# Patient Record
Sex: Male | Born: 1970 | Race: White | Hispanic: No | Marital: Married | Smoking: Current every day smoker
Health system: Southern US, Community
[De-identification: ages and names within clinical notes are randomized; demographics above are authoritative.]

## PROBLEM LIST (undated history)

## (undated) DIAGNOSIS — I493 Ventricular premature depolarization: Secondary | ICD-10-CM

## (undated) DIAGNOSIS — E785 Hyperlipidemia, unspecified: Secondary | ICD-10-CM

## (undated) DIAGNOSIS — I10 Essential (primary) hypertension: Secondary | ICD-10-CM

## (undated) DIAGNOSIS — I2510 Atherosclerotic heart disease of native coronary artery without angina pectoris: Secondary | ICD-10-CM

## (undated) DIAGNOSIS — N200 Calculus of kidney: Secondary | ICD-10-CM

## (undated) DIAGNOSIS — K5790 Diverticulosis of intestine, part unspecified, without perforation or abscess without bleeding: Secondary | ICD-10-CM

## (undated) DIAGNOSIS — T148XXA Other injury of unspecified body region, initial encounter: Secondary | ICD-10-CM

## (undated) DIAGNOSIS — I219 Acute myocardial infarction, unspecified: Secondary | ICD-10-CM

## (undated) HISTORY — DX: Hyperlipidemia, unspecified: E78.5

## (undated) HISTORY — PX: CORONARY ARTERY BYPASS GRAFT: SHX141

## (undated) HISTORY — PX: HEMORRHOID SURGERY: SHX153

## (undated) HISTORY — DX: Atherosclerotic heart disease of native coronary artery without angina pectoris: I25.10

## (undated) HISTORY — DX: Other injury of unspecified body region, initial encounter: T14.8XXA

## (undated) HISTORY — DX: Essential (primary) hypertension: I10

## (undated) HISTORY — PX: VASECTOMY: SHX75

## (undated) HISTORY — PX: CARDIAC SURGERY: SHX584

## (undated) HISTORY — DX: Diverticulosis of intestine, part unspecified, without perforation or abscess without bleeding: K57.90

## (undated) HISTORY — DX: Calculus of kidney: N20.0

## (undated) HISTORY — DX: Acute myocardial infarction, unspecified: I21.9

## (undated) HISTORY — DX: Ventricular premature depolarization: I49.3

## (undated) HISTORY — PX: OTHER SURGICAL HISTORY: SHX169

## (undated) HISTORY — PX: INGUINAL HERNIA REPAIR: SUR1180

## (undated) HISTORY — PX: LITHOTRIPSY: SUR834

---

## 2009-12-12 ENCOUNTER — Ambulatory Visit: Payer: Self-pay | Admitting: Cardiology

## 2012-10-02 ENCOUNTER — Encounter: Payer: Self-pay | Admitting: Cardiology

## 2012-10-02 ENCOUNTER — Ambulatory Visit (INDEPENDENT_AMBULATORY_CARE_PROVIDER_SITE_OTHER): Payer: Medicaid Other | Admitting: Cardiology

## 2012-10-02 VITALS — BP 113/76 | HR 93 | Ht 68.0 in | Wt 180.4 lb

## 2012-10-02 DIAGNOSIS — E785 Hyperlipidemia, unspecified: Secondary | ICD-10-CM

## 2012-10-02 DIAGNOSIS — I25810 Atherosclerosis of coronary artery bypass graft(s) without angina pectoris: Secondary | ICD-10-CM | POA: Insufficient documentation

## 2012-10-02 DIAGNOSIS — N200 Calculus of kidney: Secondary | ICD-10-CM | POA: Insufficient documentation

## 2012-10-02 MED ORDER — ASPIRIN EC 81 MG PO TBEC: 81.0000 mg | DELAYED_RELEASE_TABLET | Freq: Every day | ORAL | Status: DC

## 2012-10-02 MED ORDER — ATORVASTATIN CALCIUM 40 MG PO TABS: 40.0000 mg | ORAL_TABLET | Freq: Every day | ORAL | Status: DC

## 2012-10-02 NOTE — Patient Instructions (Addendum)
Please decrease your Asa to 81 mg a day Increase you Lipitor to 40 mg a day Continue all other medications as listed.  You have been referred to Cardiac Rehabilitation in [facility] and will be contacted about this.  Follow up with Dr Antoine Poche in 1 month.

## 2012-10-02 NOTE — Progress Notes (Signed)
HPI The patient presented with an acute myocardial infarction in early January. He was taken to [facility]. Cardiac cath demonstrated an occluded right coronary artery. The LAD had ostial 90% stenosis followed by a mid 95% stenosis. There was a moderate size mid diagonal with 80% stenosis. Ramus intermediate had 30% stenosis. The patient was treated with CABG with LIMA to the LAD, SVG to the ramus intermediate, SVG to the right coronary artery. The EF was 40% preoperatively and 55% postoperatively.  He reports a slow postoperative recovery as he had apparent hypoxemia. Since going home he has been weak and has had some continued dyspnea but he is not on home O2. His blood pressures have been low and his medications have been adjusted. More recent blood pressures have been about 100 systolic. He does have some lightheadedness but he's not having any syncope. He has some incisional chest soreness but none of the discomfort that was his acute event. He has occasional PVCs but he doesn't notice these. He's not having any PND or orthopnea. Of concern is back pain which she thinks is related to previous nephrolithiasis. He also has daily hematuria. He reports an indwelling stent.  Allergies  Allergen Reactions  . Codeine   . Demerol (Meperidine)   . Dilaudid (Hydromorphone Hcl)     IV  . Morphine And Related     Current Outpatient Prescriptions  Medication Sig Dispense Refill  . aspirin 325 MG tablet Take 325 mg by mouth daily.      Marland Kitchen atorvastatin (LIPITOR) 20 MG tablet Take 20 mg by mouth daily.      . clopidogrel (PLAVIX) 75 MG tablet Take 75 mg by mouth daily.      . DiphenhydrAMINE HCl (BENADRYL PO) Take by mouth as directed. Takes 1 tablet with pain medication      . HYDROmorphone (DILAUDID) 2 MG tablet Take 2 mg by mouth every 6 (six) hours as needed.      Marland Kitchen lisinopril (PRINIVIL,ZESTRIL) 2.5 MG tablet Take 2.5 mg by mouth daily.      Marland Kitchen loratadine (CLARITIN) 10 MG tablet Take 10 mg by  mouth daily.      Marland Kitchen METOPROLOL TARTRATE PO Take 75 mg by mouth 2 (two) times daily.      Marland Kitchen NICOTINE TD Place onto the skin.      . Tamsulosin HCl (FLOMAX) 0.4 MG CAPS Take by mouth daily.        Past Medical History  Diagnosis Date  . Nephrolithiasis   . Coronary artery disease     Past Surgical History  Procedure Date  . Kidney stents   . Coronary artery bypass graft   . Rotator cufff     right  . Hemorrhoid surgery     x 2  . Vasectomy   . Inguinal hernia repair     bilateral    Family History  Problem Relation Age of Onset  . Coronary artery disease Father 8  . Heart attack Paternal Grandmother     History   Social History  . Marital Status: Married    Spouse Name: N/A    Number of Children: 3  . Years of Education: N/A   Occupational History  . Not on file.   Social History Main Topics  . Smoking status: Former Smoker -- 1.0 packs/day for 25 years    Types: Cigarettes  . Smokeless tobacco: Not on file     Comment: Quit Jan 1st  . Alcohol Use: Not  on file  . Drug Use: Not on file  . Sexually Active: Not on file   Other Topics Concern  . Not on file   Social History Narrative   Lives at home with and 3 children.     ROS:  As stated in the HPI and negative for all other systems.  PHYSICAL EXAM BP 113/76  Pulse 93  Ht 5\' 8"  (1.727 m)  Wt 180 lb 6.4 oz (81.829 kg)  BMI 27.43 kg/m2  SpO2 99% GENERAL:  Well appearing HEENT:  Pupils equal round and reactive, fundi not visualized, oral mucosa unremarkable NECK:  No jugular venous distention, waveform within normal limits, carotid upstroke brisk and symmetric, no bruits, no thyromegaly LYMPHATICS:  No cervical, inguinal adenopathy LUNGS:  Clear to auscultation bilaterally BACK:  No CVA tenderness CHEST:  Well healed sternotomy scar. HEART:  PMI not displaced or sustained,S1 and S2 within normal limits, no S3, no S4, no clicks, no rubs, no murmurs ABD:  Flat, positive bowel sounds normal in  frequency in pitch, no bruits, no rebound, no guarding, no midline pulsatile mass, no hepatomegaly, no splenomegaly EXT:  2 plus pulses throughout, no edema, no cyanosis no clubbing SKIN:  No rashes no nodules NEURO:  Cranial nerves II through XII grossly intact, motor grossly intact throughout PSYCH:  Cognitively intact, oriented to person place and time  EKG:  Sinus rhythm, rate 86, axis within normal limits, intervals within normal limits, poor anterior R wave progression, anterior T-wave inversion. No old EKGs for comparison. 10/02/2012   ASSESSMENT AND PLAN  CAD/CABG The patient can start cardiac rehabilitation. I would like to continue the medications as listed. He is having no active symptoms. He will continue with aggressive risk reduction. For now he will remain on the meds as listed. However, given his hypotension I would back off on his lisinopril if he has any continued symptomatic low blood pressures.  Tobacco abuse - And delighted that he's not smoking and I encourage continued abstinence.  Dyslipidemia - I would like to put him a target dose of statin and I will increase his Lipitor to 40 mg daily.  Nephrolithiasis - He is most concerned about this as he is having hematuria. He has a stent in place. I have listed phone call to his urologist to discuss what procedures need to be done. Certainly we would like to defer any urologic procedures as long as possible given his recent events.

## 2012-10-28 ENCOUNTER — Telehealth: Payer: Self-pay | Admitting: Cardiology

## 2012-10-28 NOTE — Telephone Encounter (Signed)
Follow Up    Pt returning phone call. She is having some difficulty with signal, call goes straight to VM, she will call right back.

## 2012-10-28 NOTE — Telephone Encounter (Signed)
Spoke with wife who states pt "passed out this am" and his BP was really low.  The highest it's been in some time per her report is 80/68.  He has been on Lopressor 25 mg twice a day (decreased from starting at 75 mg BID due to dizziness and hypotension).  He also remains on Lisinopril 2.5 mg a day.  He has been taking Dilaudid 1 tablet daily for pain prn.  He has a kidney stone.  He is on Detrol and an antibiotic.  All other medications remain the same.  Pt did hit his nose this AM when he fell and it is swollen and red but has stopped bleeding.  His BP today when he passed on was 76/62.  She denies any dehydration just some pain d/t kidney stone.  Aware I will review with Dr Antoine Poche but to hold all medications for his BP as this point.

## 2012-10-28 NOTE — Telephone Encounter (Signed)
Left message to call back  

## 2012-10-28 NOTE — Telephone Encounter (Signed)
Pt's wife calling re problem with BP, feels dizzy, lightheaded and faint, did pass out today, pls advise [phone]

## 2012-10-28 NOTE — Telephone Encounter (Signed)
Left message for pt to hold Lisinopril for now  Continue to monitor BP and make sure to stay hydrated.  Will call back tomorrow to make sure thye received this message.

## 2012-10-28 NOTE — Telephone Encounter (Signed)
Hold lisinopril

## 2012-10-28 NOTE — Telephone Encounter (Signed)
Follow up call   Returning call back to nurse  

## 2012-10-29 ENCOUNTER — Ambulatory Visit (INDEPENDENT_AMBULATORY_CARE_PROVIDER_SITE_OTHER): Payer: Medicaid Other | Admitting: Cardiology

## 2012-10-29 ENCOUNTER — Encounter: Payer: Self-pay | Admitting: Cardiology

## 2012-10-29 VITALS — BP 104/73 | HR 95 | Ht 68.0 in | Wt 180.0 lb

## 2012-10-29 DIAGNOSIS — N200 Calculus of kidney: Secondary | ICD-10-CM

## 2012-10-29 NOTE — Telephone Encounter (Signed)
Spoke with wife who states pt isn't acting right today.  His BP is a little better 102/72 but continues to have lightheadedness and feeling weak.  Added onto schedule today for evaluation

## 2012-10-29 NOTE — Progress Notes (Signed)
HPI The patient presented with an acute myocardial infarction in early January. He was taken to [facility]. Cardiac cath demonstrated an occluded right coronary artery. The LAD had ostial 90% stenosis followed by a mid 95% stenosis. There was a moderate size mid diagonal with 80% stenosis. Ramus intermediate had 30% stenosis. The patient was treated with CABG with LIMA to the LAD, SVG to the ramus intermediate, SVG to the right coronary artery. The EF was 40% preoperatively and 55% postoperatively.   He was added to my schedule today for a syncopal episode yesterday. His blood pressure was apparently 80 and is happening. It has been running low. His wife has been holding his beta blocker the last 12 hours. His blood pressure is upper lobe today. She said he has been careful to stand up slowly. He was fixing his medications. He was standing for about 5 seconds when he lost consciousness hitting his nose. He apparently regained consciousness almost immediately. He's not been having otherwise presyncope or syncope. He's not been having any PND or orthopnea. He has some incisional chest soreness but no substernal pressure, neck or arm discomfort. He has had irritability and decreased appetite.  Allergies  Allergen Reactions  . Codeine   . Demerol (Meperidine)   . Dilaudid (Hydromorphone Hcl)     IV  . Morphine And Related     Current Outpatient Prescriptions  Medication Sig Dispense Refill  . aspirin EC 81 MG tablet Take 1 tablet (81 mg total) by mouth daily.  90 tablet  3  . atorvastatin (LIPITOR) 40 MG tablet Take 1 tablet (40 mg total) by mouth daily.  30 tablet  11  . ciprofloxacin (CIPRO) 500 MG tablet Take 500 mg by mouth 2 (two) times daily.      . clopidogrel (PLAVIX) 75 MG tablet Take 75 mg by mouth daily.      . DiphenhydrAMINE HCl (BENADRYL PO) Take by mouth as directed. Takes 1 tablet with pain medication      . HYDROmorphone (DILAUDID) 2 MG tablet Take 2 mg by mouth every 6  (six) hours as needed.      Marland Kitchen lisinopril (PRINIVIL,ZESTRIL) 2.5 MG tablet Take 2.5 mg by mouth daily.      Marland Kitchen loratadine (CLARITIN) 10 MG tablet Take 10 mg by mouth daily.      . Misc Natural Products (DETOX PO) Take by mouth daily.      . Tamsulosin HCl (FLOMAX) 0.4 MG CAPS Take by mouth daily.       No current facility-administered medications for this visit.    Past Medical History  Diagnosis Date  . Nephrolithiasis   . Coronary artery disease     Past Surgical History  Procedure Laterality Date  . Kidney stents    . Coronary artery bypass graft      LIMA to the LAD, SVG to ramus intermediate, SVG to right coronary artery 08/2012  . Rotator cufff      right  . Hemorrhoid surgery      x 2  . Vasectomy    . Inguinal hernia repair      bilateral    ROS:  As stated in the HPI and negative for all other systems.  PHYSICAL EXAM BP 104/73  Pulse 95  Ht 5\' 8"  (1.727 m)  Wt 180 lb (81.647 kg)  BMI 27.38 kg/m2 GENERAL:  Well appearing HEENT:  Pupils equal round and reactive, fundi not visualized, oral mucosa unremarkable, bruising on the bridge of  his nose NECK:  No jugular venous distention, waveform within normal limits, carotid upstroke brisk and symmetric, no bruits, no thyromegaly LYMPHATICS:  No cervical, inguinal adenopathy LUNGS:  Clear to auscultation bilaterally BACK:  No CVA tenderness CHEST:  Well healed sternotomy scar. HEART:  PMI not displaced or sustained,S1 and S2 within normal limits, no S3, no S4, no clicks, no rubs, no murmurs ABD:  Flat, positive bowel sounds normal in frequency in pitch, no bruits, no rebound, no guarding, no midline pulsatile mass, no hepatomegaly, no splenomegaly EXT:  2 plus pulses throughout, no edema, no cyanosis no clubbing SKIN:  No rashes no nodules NEURO:  Cranial nerves II through XII grossly intact, motor grossly intact throughout PSYCH:  Cognitively intact, oriented to person place and time   ASSESSMENT AND  PLAN  CAD/CABG The patient is going to start cardiac rehabilitation. I'm going to discontinue the beta blocker and ACE inhibitor completely. We will keep an eye on her blood pressure. At this point I don't think this was a primary arrhythmia.  Tobacco abuse - I am delighted that he's not smoking and I encourage continued abstinence.  Dyslipidemia - At the last visit I increased his Lipitor to 40 mg daily. We will followup repeat lipid profiles in the future.  Nephrolithiasis - He has a stent in place and daily hematuria. He was having pain but did recently see his urologist. He wants to hold off on any procedures as he would need to be off of aspirin and Plavix. He was treated medically with antibiotics and Detrol. He says his symptoms are improved though he still has hematuria. I would like to wait about 3 months post bypass for an elective procedure. We will send him for CBC.  Depression - I think he has some symptoms consistent with depression and I have asked him to schedule with Juliette Alcide, MD to discuss further management of this.

## 2012-10-29 NOTE — Patient Instructions (Addendum)
Please stop your Lisinopril and Metoprolol. Continue all other medications as listed.  Please have a CBC at your primary care doctor office.  Follow up with Dr Antoine Poche in 1 month.  Follow up with Dr Quintin Alto.

## 2012-11-11 ENCOUNTER — Encounter: Payer: Self-pay | Admitting: Cardiology

## 2012-11-11 ENCOUNTER — Ambulatory Visit (INDEPENDENT_AMBULATORY_CARE_PROVIDER_SITE_OTHER): Payer: Medicaid Other | Admitting: Cardiology

## 2012-11-11 VITALS — BP 111/79 | HR 76 | Ht 68.0 in | Wt 183.0 lb

## 2012-11-11 NOTE — Progress Notes (Signed)
HPI The patient presented with an acute myocardial infarction in early January. He was taken to [facility]. Cardiac cath demonstrated an occluded right coronary artery. The LAD had ostial 90% stenosis followed by a mid 95% stenosis. There was a moderate size mid diagonal with 80% stenosis. Ramus intermediate had 30% stenosis. The patient was treated with CABG with LIMA to the LAD, SVG to the ramus intermediate, SVG to the right coronary artery. The EF was 40% preoperatively and 55% postoperatively.   At the last visit he had reported a syncopal episode. His blood pressure had been running low. I took him off of his ACE inhibitor and beta blocker. He said his blood pressure has now come back up he feels better. He is still a little lightheaded if he gets up quickly but he is avoiding this. He's had no presyncope or syncope. He's had no new shortness of breath, PND or orthopnea. He has had no weight gain or edema. He still having minimal incisional soreness.  Allergies  Allergen Reactions  . Codeine   . Demerol (Meperidine)   . Dilaudid (Hydromorphone Hcl)     IV  . Morphine And Related     Current Outpatient Prescriptions  Medication Sig Dispense Refill  . aspirin EC 81 MG tablet Take 1 tablet (81 mg total) by mouth daily.  90 tablet  3  . atorvastatin (LIPITOR) 40 MG tablet Take 1 tablet (40 mg total) by mouth daily.  30 tablet  11  . clopidogrel (PLAVIX) 75 MG tablet Take 75 mg by mouth daily.      . DiphenhydrAMINE HCl (BENADRYL PO) Take by mouth as directed. Takes 1 tablet with pain medication      . FLUoxetine (PROZAC) 20 MG capsule Take 20 mg by mouth daily.      Marland Kitchen HYDROmorphone (DILAUDID) 2 MG tablet Take 2 mg by mouth every 6 (six) hours as needed.      . loratadine (CLARITIN) 10 MG tablet Take 10 mg by mouth daily.      . Misc Natural Products (DETOX PO) Take by mouth daily.      . Tamsulosin HCl (FLOMAX) 0.4 MG CAPS Take by mouth daily.       No current  facility-administered medications for this visit.    Past Medical History  Diagnosis Date  . Nephrolithiasis   . Coronary artery disease     Past Surgical History  Procedure Laterality Date  . Kidney stents    . Coronary artery bypass graft      LIMA to the LAD, SVG to ramus intermediate, SVG to right coronary artery 08/2012  . Rotator cufff      right  . Hemorrhoid surgery      x 2  . Vasectomy    . Inguinal hernia repair      bilateral    ROS:  As stated in the HPI and negative for all other systems.  PHYSICAL EXAM BP 111/79  Pulse 76  Ht 5\' 8"  (1.727 m)  Wt 183 lb (83.008 kg)  BMI 27.83 kg/m2 GENERAL:  Well appearing HEENT:  Pupils equal round and reactive, fundi not visualized, oral mucosa unremarkable, bruising on the bridge of his nose NECK:  No jugular venous distention, waveform within normal limits, carotid upstroke brisk and symmetric, no bruits, no thyromegaly LUNGS:  Clear to auscultation bilaterally CHEST:  Well healed sternotomy scar. HEART:  PMI not displaced or sustained,S1 and S2 within normal limits, no S3, no S4, no  clicks, no rubs, no murmurs ABD:  Flat, positive bowel sounds normal in frequency in pitch, no bruits, no rebound, no guarding, no midline pulsatile mass, no hepatomegaly, no splenomegaly EXT:  2 plus pulses throughout, no edema, no cyanosis no clubbing   ASSESSMENT AND PLAN  CAD/CABG He is starting to recover. His blood pressure is better off of ACE and beta blockers. I will probably reassess an echocardiogram after I see him next as it was low he initially presented with this event but had recovered to 50% at the time of discharge.  Tobacco abuse - I am delighted that he's not smoking and I encourage continued abstinence.  Dyslipidemia - I will follow this in the future.   Nephrolithiasis - He has a stent in place and daily hematuria. He was having pain but did recently see his urologist. He was treated medically with antibiotics  and Detrol.  I think that he could now have this procedure and stop the Plavix 5 - 7 days before.  I would like to have him continue the ASA if possible.  Depression - He has started Prozac.  He will follow with Juliette Alcide, MD

## 2012-11-11 NOTE — Patient Instructions (Signed)
Continue all current medications. Follow up in  4 months  

## 2012-11-13 ENCOUNTER — Ambulatory Visit: Payer: Medicaid Other | Admitting: Cardiology

## 2012-12-07 DIAGNOSIS — I2510 Atherosclerotic heart disease of native coronary artery without angina pectoris: Secondary | ICD-10-CM

## 2012-12-08 DIAGNOSIS — I2510 Atherosclerotic heart disease of native coronary artery without angina pectoris: Secondary | ICD-10-CM

## 2012-12-19 ENCOUNTER — Telehealth: Payer: Self-pay | Admitting: Cardiology

## 2012-12-19 NOTE — Telephone Encounter (Signed)
New problem    Recent discharge from hospital   Wife wants him to be seen on Monday .  Aware of appt on 4/28 .   Walking outside came into the home on yesterday .  C/O sob .chestpain

## 2012-12-19 NOTE — Telephone Encounter (Signed)
Patient and his wife called regarding occasional chest discomfort and SOB that started after his discharge from hospital on 4/16 for lithotripsy. Discomfort is at worst 5 on 10 scale and at variable times/frequency/activity levels approximately once daily. States he had a few minutes of fast heart rate last night that subsided at rest. Patient's wife took BP/HR while on phone - 110/70, 70. Not currently in discomfort or SOB but last night did experience discomfort and SOB when tossing softball to his daughter. Had frequency of urination during the night. Does not have NTG ordered and states he has never taken it. Denies that chest discomfort is similar to previous symptomology related to MI in January, 2014. Plavix was not restarted upon discharge from hospital. Patient has appt with Dr. Antoine Poche this Monday, 4/28. Reviewed patient's information/hx with Norma Fredrickson, PA, at this time. Patient advised to call 911 and go to Emergency Room should he experience any acute chest pain/SOB prior to his appt on 4/28.  Patient advised to call his nephrologist and PCP to report above concerns, as well, so as to address kidney function concerns expressed by patient's wife. Patient also advised to call [facility] back should patient have other symptomology or concerns or any worsening of symptoms. Wife verbalized agreement with plan above.

## 2012-12-22 ENCOUNTER — Ambulatory Visit (INDEPENDENT_AMBULATORY_CARE_PROVIDER_SITE_OTHER): Payer: Medicaid Other | Admitting: Cardiology

## 2012-12-22 ENCOUNTER — Encounter: Payer: Self-pay | Admitting: Cardiology

## 2012-12-22 VITALS — BP 119/87 | HR 74 | Ht 68.0 in | Wt 182.0 lb

## 2012-12-22 DIAGNOSIS — R0602 Shortness of breath: Secondary | ICD-10-CM

## 2012-12-22 DIAGNOSIS — I25810 Atherosclerosis of coronary artery bypass graft(s) without angina pectoris: Secondary | ICD-10-CM

## 2012-12-22 DIAGNOSIS — D62 Acute posthemorrhagic anemia: Secondary | ICD-10-CM

## 2012-12-22 LAB — BRAIN NATRIURETIC PEPTIDE: Pro B Natriuretic peptide (BNP): 61 pg/mL (ref 0.0–100.0)

## 2012-12-22 LAB — CBC
HCT: 33.3 % — ABNORMAL LOW (ref 39.0–52.0)
MCV: 80.5 fl (ref 78.0–100.0)
Platelets: 457 10*3/uL — ABNORMAL HIGH (ref 150.0–400.0)
RBC: 4.13 Mil/uL — ABNORMAL LOW (ref 4.22–5.81)

## 2012-12-22 NOTE — Progress Notes (Signed)
HPI The patient presented with an acute myocardial infarction in early January. He was taken to [facility]. Cardiac cath demonstrated an occluded right coronary artery. The LAD had ostial 90% stenosis followed by a mid 95% stenosis. There was a moderate size mid diagonal with 80% stenosis. Ramus intermediate had 30% stenosis. The patient was treated with CABG with LIMA to the LAD, SVG to the ramus intermediate, SVG to the right coronary artery. The EF was 40% preoperatively and 55% postoperatively. On follow up he had had a syncopal episode. His blood pressure had been running low. I took him off of his ACE inhibitor and beta blocker. Since I last saw he had an admission with urologic procedure to remove a ureteral stent and do lithotripsy for nephrolithiasis. Unfortunately this was complicated by perinephric hematoma.  He presents for followup.  Since being released from the hospital he has continued to be a little short of breath with activities such as walking up a flight of stairs. He apparently did have an elevated BNP in the hospital. He has not had any new substernal chest pain to his head falling sensation related to his sternal wound. He denies any neck or arm discomfort. He is not describing PND or orthopnea. He's had no weight gain or edema. We did walking around the office today and his oxygen saturation fell to 98-95% and he was slightly short of breath. Of note his hemoglobin did drop with his neurologic events down to about 9.5 by his report but was back up slightly.     Allergies  Allergen Reactions  . Codeine   . Demerol (Meperidine)   . Dilaudid (Hydromorphone Hcl)     IV  . Morphine And Related     Current Outpatient Prescriptions  Medication Sig Dispense Refill  . aspirin EC 81 MG tablet Take 1 tablet (81 mg total) by mouth daily.  90 tablet  3  . atorvastatin (LIPITOR) 40 MG tablet Take 1 tablet (40 mg total) by mouth daily.  30 tablet  11  . DiphenhydrAMINE HCl  (BENADRYL PO) Take by mouth as directed. Takes 1 tablet with pain medication      . FLUoxetine (PROZAC) 20 MG capsule Take 20 mg by mouth daily.      Marland Kitchen loratadine (CLARITIN) 10 MG tablet Take 10 mg by mouth daily.      . Misc Natural Products (DETOX PO) Take by mouth daily.      . Tamsulosin HCl (FLOMAX) 0.4 MG CAPS Take by mouth daily.       No current facility-administered medications for this visit.    Past Medical History  Diagnosis Date  . Nephrolithiasis   . Coronary artery disease     Past Surgical History  Procedure Laterality Date  . Kidney stents    . Coronary artery bypass graft      LIMA to the LAD, SVG to ramus intermediate, SVG to right coronary artery 08/2012  . Rotator cufff      right  . Hemorrhoid surgery      x 2  . Vasectomy    . Inguinal hernia repair      bilateral    ROS:  As stated in the HPI and negative for all other systems.  PHYSICAL EXAM BP 119/87  Pulse 74  Ht 5\' 8"  (1.727 m)  Wt 182 lb (82.555 kg)  BMI 27.68 kg/m2 GENERAL:  Well appearing HEENT:  Pupils equal round and reactive, fundi not visualized, oral mucosa unremarkable,  bruising on the bridge of his nose NECK:  No jugular venous distention, waveform within normal limits, carotid upstroke brisk and symmetric, no bruits, no thyromegaly LUNGS:  Clear to auscultation bilaterally CHEST:  Well healed sternotomy scar. HEART:  PMI not displaced or sustained,S1 and S2 within normal limits, no S3, no S4, no clicks, no rubs, no murmurs ABD:  Flat, positive bowel sounds normal in frequency in pitch, no bruits, no rebound, no guarding, no midline pulsatile mass, no hepatomegaly, no splenomegaly EXT:  2 plus pulses throughout, no edema, no cyanosis no clubbing  EKG:  Sinus rhythm, rate 74, RAD, old anteroseptal infarct.  No acute ST T wave changes.  12/22/2012  ASSESSMENT AND PLAN  CAD/CABG The patient will continue the meds as listed.  He would be cleared from my standpoint to resume cardiac  rehabilitation although he apparently needs to be cleared by his urologist as well.  I will be repeating an echocardiogram in the future as his EF was low normal post surgery. I reviewed this.  His current dyspnea might be related to his anemia and some deconditioning. I will repeat a CBC and a BNP level.  Tobacco abuse - I am delighted that he's not smoking and I encourage continued abstinence.  Dyslipidemia - He will remain on the meds as listed.  Nephrolithiasis - He will have followup per urology. He will remain on aspirin only and Plavix has been discontinued.

## 2012-12-22 NOTE — Patient Instructions (Addendum)
The current medical regimen is effective;  continue present plan and medications.  Restart Cardiac Rehab.  Please have blood work today. (CBC, BNP)  Follow up with Dr Antoine Poche in 3 months

## 2013-01-12 ENCOUNTER — Telehealth: Payer: Self-pay | Admitting: Cardiology

## 2013-01-12 NOTE — Telephone Encounter (Signed)
New Prob      Pt experiencing pinching feeling in chest while doing cardiac rehab, BP was running low for a while but is now running high along with high heart rate, oxygen levels have been good, hemoglobin still low. Also cardiac rehab has informed pt that he needs to find a new facility due to them closing down soon. Wife would like to speak to nurse.

## 2013-01-13 NOTE — Telephone Encounter (Signed)
Spoke with her at length concerning pt's BP and HR.  She was instructed to call if BP is about 180/100 and HR is consistently.  She will take his VS daily.  She will have cardiac rehab fax [place] his most recent VS.  Attempted to give pt an earlier appt with NP/PA but they will wait for appt with Dr Antoine Poche in [place] unless something changes.  According to wife [facility] Re[facility] in [place] is closing and they need a referral to another rehab.  Further cardiac rehab will be discussed at office visit.

## 2013-02-04 ENCOUNTER — Ambulatory Visit (INDEPENDENT_AMBULATORY_CARE_PROVIDER_SITE_OTHER): Payer: Medicaid Other | Admitting: Cardiology

## 2013-02-04 ENCOUNTER — Encounter: Payer: Self-pay | Admitting: Cardiology

## 2013-02-04 VITALS — BP 120/90 | HR 80 | Ht 68.0 in | Wt 189.4 lb

## 2013-02-04 DIAGNOSIS — I25810 Atherosclerosis of coronary artery bypass graft(s) without angina pectoris: Secondary | ICD-10-CM

## 2013-02-04 NOTE — Patient Instructions (Addendum)
The current medical regimen is effective;  continue present plan and medications.  Follow up in 6 months with Dr Hochrein.  You will receive a letter in the mail 2 months before you are due.  Please call us when you receive this letter to schedule your follow up appointment.  

## 2013-02-04 NOTE — Progress Notes (Signed)
HPI The patient presented with an acute myocardial infarction in early January. He was taken to [facility]. Cardiac cath demonstrated an occluded right coronary artery. The LAD had ostial 90% stenosis followed by a mid 95% stenosis. There was a moderate size mid diagonal with 80% stenosis. Ramus intermediate had 30% stenosis. The patient was treated with CABG with LIMA to the LAD, SVG to the ramus intermediate, SVG to the right coronary artery. The EF was 40% preoperatively and 55% postoperatively.   He presents for followup.  Since I last saw him he continues to abstain from cigarettes. He is doing his cardiac rehabilitation. I reviewed the blood pressures and heart rate. These have been fine though he reports a couple of episodes of palpitations at home. He's had some increased blood pressures. However, he's had none of the low blood pressures or syncope that he was having. He's not having any chest pressure, neck or arm discomfort. He has no shortness of breath, PND or orthopnea. He does have some mild swelling in his hands and occasionally. He's had followup of his subcapsuler renal hematoma and it is still noted on abdominal CT. He was also noted to have aortic calcifications which was thought to be fairly extensive.  I reviewed the reports of recent imaging.  Allergies  Allergen Reactions  . Codeine   . Demerol (Meperidine)   . Dilaudid (Hydromorphone Hcl)     IV  . Morphine And Related     Current Outpatient Prescriptions  Medication Sig Dispense Refill  . aspirin EC 81 MG tablet Take 81 mg by mouth 2 (two) times daily.      Marland Kitchen atorvastatin (LIPITOR) 40 MG tablet Take 1 tablet (40 mg total) by mouth daily.  30 tablet  11  . FLUoxetine (PROZAC) 20 MG capsule Take 20 mg by mouth daily.      Marland Kitchen loratadine (CLARITIN) 10 MG tablet Take 10 mg by mouth daily.      . Tamsulosin HCl (FLOMAX) 0.4 MG CAPS Take by mouth daily.       No current facility-administered medications for this  visit.    Past Medical History  Diagnosis Date  . Nephrolithiasis   . Coronary artery disease     Past Surgical History  Procedure Laterality Date  . Kidney stents    . Coronary artery bypass graft      LIMA to the LAD, SVG to ramus intermediate, SVG to right coronary artery 08/2012  . Rotator cufff      right  . Hemorrhoid surgery      x 2  . Vasectomy    . Inguinal hernia repair      bilateral    ROS:  As stated in the HPI and negative for all other systems.  PHYSICAL EXAM BP 120/90  Pulse 80  Ht 5\' 8"  (1.727 m)  Wt 189 lb 6.4 oz (85.911 kg)  BMI 28.8 kg/m2 GENERAL:  Well appearing HEENT:  Pupils equal round and reactive, fundi not visualized, oral mucosa unremarkable, bruising on the bridge of his nose NECK:  No jugular venous distention, waveform within normal limits, carotid upstroke brisk and symmetric, no bruits, no thyromegaly LUNGS:  Clear to auscultation bilaterally CHEST:  Well healed sternotomy scar. HEART:  PMI not displaced or sustained,S1 and S2 within normal limits, no S3, no S4, no clicks, no rubs, no murmurs ABD:  Flat, positive bowel sounds normal in frequency in pitch, no bruits, no rebound, no guarding, no midline pulsatile mass,  no hepatomegaly, no splenomegaly EXT:  2 plus pulses throughout, no edema, no cyanosis no clubbing  EKG:  Sinus rhythm, rate 74, RAD, old anteroseptal infarct.  No acute ST T wave changes.  02/04/2013  ASSESSMENT AND PLAN  CAD/CABG The patient will continue the meds as listed.  We will continue cardiac rehabilitation. He is doing well with secondary risk reduction.  Tobacco abuse - I am delighted that he's not smoking and I encourage continued abstinence.  Dyslipidemia - He will remain on the meds as listed.  In the future will followup with fasting lipid profile.  Nephrolithiasis - He will have followup per urology.

## 2013-02-24 ENCOUNTER — Encounter (HOSPITAL_COMMUNITY): Payer: Self-pay

## 2013-02-24 ENCOUNTER — Telehealth: Payer: Self-pay | Admitting: Cardiology

## 2013-02-24 ENCOUNTER — Encounter (HOSPITAL_COMMUNITY)
Admission: RE | Admit: 2013-02-24 | Discharge: 2013-02-24 | Disposition: A | Discharge status: Home / Self Care | Payer: Medicaid Other | Source: Ambulatory Visit | Attending: Cardiology | Admitting: Cardiology

## 2013-02-24 VITALS — BP 112/70 | HR 84 | Ht 68.0 in | Wt 194.6 lb

## 2013-02-24 DIAGNOSIS — Z951 Presence of aortocoronary bypass graft: Secondary | ICD-10-CM | POA: Insufficient documentation

## 2013-02-24 DIAGNOSIS — Z5189 Encounter for other specified aftercare: Secondary | ICD-10-CM | POA: Insufficient documentation

## 2013-02-24 NOTE — Progress Notes (Addendum)
Patient was sent to [facility] via [facility] by Dr. Antoine Poche due to CABGx3 surgery V45.81on 09/02/12. During orientation advised patient on arrival and appointment times what to wear, what to do before, during and after exercise. Reviewed attendance and class policy. Talked about inclement weather and class consultation policy. Pt is scheduled to start Cardiac Rehab on 03/02/13 at 11am. Pt was advised to come to class 5 minutes before class starts. He was also given instructions on meeting with the dietician and attending the Family Structure classes. Pt is eager to get started.

## 2013-02-24 NOTE — Patient Instructions (Signed)
Pt has finished orientation and is scheduled to start CR on 03/02/13 at 11 am. Pt has been instructed to arrive to class 15 minutes early for scheduled class. Pt has been instructed to wear comfortable clothing and shoes with rubber soles. Pt has been told to take their medications 1 hour prior to coming to class.  If the patient is not going to attend class, he/she has been instructed to call.

## 2013-03-02 ENCOUNTER — Encounter (HOSPITAL_COMMUNITY)
Admission: RE | Admit: 2013-03-02 | Discharge: 2013-03-02 | Disposition: A | Discharge status: Home / Self Care | Payer: Medicaid Other | Source: Ambulatory Visit | Attending: Cardiology | Admitting: Cardiology

## 2013-03-04 ENCOUNTER — Encounter (HOSPITAL_COMMUNITY)
Admission: RE | Admit: 2013-03-04 | Discharge: 2013-03-04 | Disposition: A | Discharge status: Home / Self Care | Payer: Medicaid Other | Source: Ambulatory Visit | Attending: Cardiology | Admitting: Cardiology

## 2013-03-05 ENCOUNTER — Encounter (HOSPITAL_COMMUNITY): Payer: Self-pay | Admitting: Cardiology

## 2013-03-05 ENCOUNTER — Emergency Department (HOSPITAL_COMMUNITY)
Admission: EM | Admit: 2013-03-05 | Discharge: 2013-03-05 | Disposition: A | Discharge status: Home / Self Care | Payer: Medicaid Other | Attending: Emergency Medicine | Admitting: Emergency Medicine

## 2013-03-05 DIAGNOSIS — Z87891 Personal history of nicotine dependence: Secondary | ICD-10-CM | POA: Insufficient documentation

## 2013-03-05 DIAGNOSIS — M25519 Pain in unspecified shoulder: Secondary | ICD-10-CM | POA: Insufficient documentation

## 2013-03-05 DIAGNOSIS — Z951 Presence of aortocoronary bypass graft: Secondary | ICD-10-CM | POA: Insufficient documentation

## 2013-03-05 DIAGNOSIS — Z87828 Personal history of other (healed) physical injury and trauma: Secondary | ICD-10-CM | POA: Insufficient documentation

## 2013-03-05 DIAGNOSIS — I2510 Atherosclerotic heart disease of native coronary artery without angina pectoris: Secondary | ICD-10-CM | POA: Insufficient documentation

## 2013-03-05 DIAGNOSIS — Z79899 Other long term (current) drug therapy: Secondary | ICD-10-CM | POA: Insufficient documentation

## 2013-03-05 DIAGNOSIS — M25512 Pain in left shoulder: Secondary | ICD-10-CM

## 2013-03-05 DIAGNOSIS — Z87448 Personal history of other diseases of urinary system: Secondary | ICD-10-CM | POA: Insufficient documentation

## 2013-03-05 DIAGNOSIS — Z7982 Long term (current) use of aspirin: Secondary | ICD-10-CM | POA: Insufficient documentation

## 2013-03-05 LAB — CBC
Platelets: 252 10*3/uL (ref 150–400)
RBC: 5.14 MIL/uL (ref 4.22–5.81)
RDW: 17.8 % — ABNORMAL HIGH (ref 11.5–15.5)
WBC: 7.1 10*3/uL (ref 4.0–10.5)

## 2013-03-05 LAB — POCT I-STAT TROPONIN I: Troponin i, poc: 0 ng/mL (ref 0.00–0.08)

## 2013-03-05 LAB — BASIC METABOLIC PANEL
CO2: 25 mEq/L (ref 19–32)
Chloride: 106 mEq/L (ref 96–112)
Creatinine, Ser: 0.99 mg/dL (ref 0.50–1.35)
GFR calc Af Amer: >90 mL/min (ref >90)
Potassium: 4.7 mEq/L (ref 3.5–5.1)
Sodium: 140 mEq/L (ref 135–145)

## 2013-03-05 LAB — PRO B NATRIURETIC PEPTIDE: Pro B Natriuretic peptide (BNP): 131.4 pg/mL — ABNORMAL HIGH (ref 0–125)

## 2013-03-05 MED ORDER — HYDROMORPHONE HCL 4 MG PO TABS: 4.0000 mg | ORAL_TABLET | ORAL | Status: DC | PRN

## 2013-03-05 MED ORDER — BUTORPHANOL TARTRATE 2 MG/ML IJ SOLN
2.0000 mg | INTRAMUSCULAR | Status: AC
  Administered 2013-03-05: 2 mg via INTRAVENOUS
  Filled 2013-03-05: qty 1

## 2013-03-05 NOTE — ED Notes (Signed)
Pt reports left sided chest pain that started this morning. States that he had triple bypass done on Jan 1st of this year done at [facility]. States that he took dilaudid this morning that did not help the pain. Pt reports some SOB throughout the day. States that he sees doctor Hochrien.

## 2013-03-05 NOTE — Telephone Encounter (Signed)
LMTCB

## 2013-03-05 NOTE — ED Provider Notes (Signed)
History    CSN: [id number] Arrival date & time 03/05/13  1754  First MD Initiated Contact with Patient 03/05/13 1822     Chief Complaint  Patient presents with  . Chest Pain   (Consider location/radiation/quality/duration/timing/severity/associated sxs/prior Treatment) HPI This 42 year old male has a history of coronary artery disease with prior bypass in January of this year has been doing well at cardiac rehabilitation, today he woke up with left shoulder pain positional nonexertional radiates towards his left upper chest with left upper chest wall tenderness but no exertional chest pain no chest pressure no cough no shortness of breath no abdominal pain no nausea no sweats no distal weakness or numbness to his left arm no neck pain he has exquisite tenderness to his left shoulder worse with movement with decreased range of motion to the left arm at the shoulder due to limited due to pain with no significant improvement after one tablet of Dilaudid orally at home today he had left over from several months ago, his pain is severe tender worse with left shoulder movement or palpation and is nonpleuritic. Past Medical History  Diagnosis Date  . Nephrolithiasis   . Coronary artery disease   . Hematoma     Subcapsule renal   Past Surgical History  Procedure Laterality Date  . Kidney stents    . Coronary artery bypass graft      LIMA to the LAD, SVG to ramus intermediate, SVG to right coronary artery 08/2012  . Rotator cufff      right  . Hemorrhoid surgery      x 2  . Vasectomy    . Inguinal hernia repair      bilateral   Family History  Problem Relation Age of Onset  . Coronary artery disease Father 81  . Heart attack Paternal Grandmother    History  Substance Use Topics  . Smoking status: Former Smoker -- 0.25 packs/day for 25 years    Types: Cigarettes  . Smokeless tobacco: Not on file     Comment: Quit Jan 1st  . Alcohol Use: No    Review of Systems 10 Systems  reviewed and are negative for acute change except as noted in the HPI. Allergies  Codeine; Demerol; Dilaudid; and Morphine and related  Home Medications   Current Outpatient Rx  Name  Route  Sig  Dispense  Refill  . aspirin EC 81 MG tablet   Oral   Take 81 mg by mouth 2 (two) times daily.         Marland Kitchen atorvastatin (LIPITOR) 40 MG tablet   Oral   Take 40 mg by mouth at bedtime.         Marland Kitchen FLUoxetine (PROZAC) 20 MG capsule   Oral   Take 20 mg by mouth every morning.          . loratadine (CLARITIN) 10 MG tablet   Oral   Take 10 mg by mouth daily.         . Tamsulosin HCl (FLOMAX) 0.4 MG CAPS   Oral   Take 0.4 mg by mouth every morning.          Marland Kitchen HYDROmorphone (DILAUDID) 4 MG tablet   Oral   Take 1 tablet (4 mg total) by mouth every 4 (four) hours as needed for pain.   15 tablet   0    BP 108/73  Pulse 69  Temp(Src) 98.6 F (37 C) (Oral)  Resp 16  SpO2 97% Physical Exam  Nursing note and vitals reviewed. Constitutional:  Awake, alert, nontoxic appearance.  HENT:  Head: Atraumatic.  Eyes: Right eye exhibits no discharge. Left eye exhibits no discharge.  Neck: Neck supple.  Cardiovascular: Normal rate and regular rhythm.   No murmur heard. Pulmonary/Chest: Effort normal and breath sounds normal. No respiratory distress. He has no wheezes. He has no rales. He exhibits tenderness.  Mild left upper chest tenderness reproduces pain  Abdominal: Soft. Bowel sounds are normal. He exhibits no distension and no mass. There is no tenderness. There is no rebound and no guarding.  Musculoskeletal: He exhibits tenderness. He exhibits no edema.  Baseline ROM, no obvious new focal weakness.right arm and both legs nontender. Posterior neck is nontender including no cervical spine tenderness. Back is nontender. Left arm is exquisitely tender at the shoulder but nontender at the wrist forearm and elbow. Left hand is warm with capillary refill less than 2 seconds radial pulse  intact normal light touch with good strength in the left hand in the distribution of the median radial and ulnar nerve function he has negative shoulder drop test but has limited abduction flexion and internal and external rotation all limited by pain with tenderness to his shoulder especially over the proximal biceps tendon region with no tenderness to the clavicle or acromioclavicular joint  Neurological:  Mental status and motor strength appears baseline for patient and situation.  Skin: No rash noted.  Psychiatric: He has a normal mood and affect.    ED Course  Procedures (including critical care time) ECG: sinus rhythm, ventricular rate 83, normal axis, anterior and septal Q waves, no acute ischemic changes noted Patient / Family / Caregiver informed of clinical course, understand medical decision-making process, and agree with plan. Labs Reviewed  CBC - Abnormal; Notable for the following:    RDW 17.8 (*)    All other components within normal limits  PRO B NATRIURETIC PEPTIDE - Abnormal; Notable for the following:    Pro B Natriuretic peptide (BNP) 131.4 (*)    All other components within normal limits  BASIC METABOLIC PANEL  POCT I-STAT TROPONIN I   No results found. 1. Left shoulder pain     MDM  I doubt any other EMC precluding discharge at this time including, but not necessarily limited to the following:ACS, CVA.  Hurman Horn, MD 03/07/13 2029

## 2013-03-05 NOTE — Telephone Encounter (Signed)
New problem  Pt's wife states pt is having left shoulder pain and it radiates down to his elbow.  Pt wife said that it is a constant pain and he has taking pain meds and it is not helping him.

## 2013-03-05 NOTE — Telephone Encounter (Signed)
Spoke with patient's wife who states patient is having severe left shoulder pain that radiates underneath the arm down to elbow and over to left chest onset this morning.  Patient's wife Juan Wade) states the patient rates the pain a "10+" on scale of 1-10 and Dilaudid is not providing any pain relief.   Juan Wade states that the patient has a hx of a large hematoma surrounding his kidney which is being followed by urology.  She states that the last scan showed that the blood clot has not been absorbed or has not decreased in size from the previous scan a few months before.  Juan Wade denies that the patient has done anything that would cause musculoskeletal problems.  Patient is enrolled in cardiac rehab at [facility] and was last there yesterday.  Patient states he has never felt pain like this before.  Juan Wade asked if Dr. Antoine Poche was in the [place] office.  I advised her that he would not be back in [place] for > 1 week.  Juan Wade states she is afraid to wait until tomorrow to be seen in the office.  I advised her that since she felt that patient needed to be seen today that she should take patient to the hospital.  Juan Wade states that she prefers [facility] so she will bring patient to [place].  Juan Wade, hospital liason was paged to notify them of patient's upcoming arrival.

## 2013-03-06 ENCOUNTER — Encounter (HOSPITAL_COMMUNITY): Payer: Medicaid Other

## 2013-03-09 ENCOUNTER — Encounter (HOSPITAL_COMMUNITY)
Admission: RE | Admit: 2013-03-09 | Discharge: 2013-03-09 | Disposition: A | Discharge status: Home / Self Care | Payer: Medicaid Other | Source: Ambulatory Visit | Attending: Cardiology | Admitting: Cardiology

## 2013-03-11 ENCOUNTER — Encounter (HOSPITAL_COMMUNITY)
Admission: RE | Admit: 2013-03-11 | Discharge: 2013-03-11 | Disposition: A | Discharge status: Home / Self Care | Payer: Medicaid Other | Source: Ambulatory Visit | Attending: Cardiology | Admitting: Cardiology

## 2013-03-13 ENCOUNTER — Encounter (HOSPITAL_COMMUNITY)
Admission: RE | Admit: 2013-03-13 | Discharge: 2013-03-13 | Disposition: A | Discharge status: Home / Self Care | Payer: Medicaid Other | Source: Ambulatory Visit | Attending: Cardiology | Admitting: Cardiology

## 2013-03-16 ENCOUNTER — Encounter (HOSPITAL_COMMUNITY)
Admission: RE | Admit: 2013-03-16 | Discharge: 2013-03-16 | Disposition: A | Discharge status: Home / Self Care | Payer: Medicaid Other | Source: Ambulatory Visit | Attending: Cardiology | Admitting: Cardiology

## 2013-03-18 ENCOUNTER — Ambulatory Visit: Payer: Medicaid Other | Admitting: Cardiology

## 2013-03-18 ENCOUNTER — Encounter (HOSPITAL_COMMUNITY)
Admission: RE | Admit: 2013-03-18 | Discharge: 2013-03-18 | Disposition: A | Discharge status: Home / Self Care | Payer: Medicaid Other | Source: Ambulatory Visit | Attending: Cardiology | Admitting: Cardiology

## 2013-03-20 ENCOUNTER — Encounter (HOSPITAL_COMMUNITY)
Admission: RE | Admit: 2013-03-20 | Discharge: 2013-03-20 | Disposition: A | Discharge status: Home / Self Care | Payer: Medicaid Other | Source: Ambulatory Visit | Attending: Cardiology | Admitting: Cardiology

## 2013-03-20 ENCOUNTER — Ambulatory Visit: Payer: Medicaid Other | Admitting: Cardiology

## 2013-03-23 ENCOUNTER — Encounter (HOSPITAL_COMMUNITY)
Admission: RE | Admit: 2013-03-23 | Discharge: 2013-03-23 | Disposition: A | Discharge status: Home / Self Care | Payer: Medicaid Other | Source: Ambulatory Visit | Attending: Cardiology | Admitting: Cardiology

## 2013-03-25 ENCOUNTER — Encounter (HOSPITAL_COMMUNITY)
Admission: RE | Admit: 2013-03-25 | Discharge: 2013-03-25 | Disposition: A | Discharge status: Home / Self Care | Payer: Medicaid Other | Source: Ambulatory Visit | Attending: Cardiology | Admitting: Cardiology

## 2013-03-27 ENCOUNTER — Encounter (HOSPITAL_COMMUNITY): Payer: Medicaid Other

## 2013-03-30 ENCOUNTER — Encounter (HOSPITAL_COMMUNITY)
Admission: RE | Admit: 2013-03-30 | Discharge: 2013-03-30 | Disposition: A | Discharge status: Home / Self Care | Payer: Medicaid Other | Source: Ambulatory Visit | Attending: Cardiology | Admitting: Cardiology

## 2013-03-30 DIAGNOSIS — Z951 Presence of aortocoronary bypass graft: Secondary | ICD-10-CM | POA: Insufficient documentation

## 2013-03-30 DIAGNOSIS — Z5189 Encounter for other specified aftercare: Secondary | ICD-10-CM | POA: Insufficient documentation

## 2013-04-01 ENCOUNTER — Encounter (HOSPITAL_COMMUNITY)
Admission: RE | Admit: 2013-04-01 | Discharge: 2013-04-01 | Disposition: A | Discharge status: Home / Self Care | Payer: Medicaid Other | Source: Ambulatory Visit | Attending: Cardiology | Admitting: Cardiology

## 2013-04-03 ENCOUNTER — Encounter (HOSPITAL_COMMUNITY)
Admission: RE | Admit: 2013-04-03 | Discharge: 2013-04-03 | Disposition: A | Discharge status: Home / Self Care | Payer: Medicaid Other | Source: Ambulatory Visit | Attending: Cardiology | Admitting: Cardiology

## 2013-04-06 ENCOUNTER — Encounter (HOSPITAL_COMMUNITY): Payer: Medicaid Other

## 2013-04-08 ENCOUNTER — Encounter (HOSPITAL_COMMUNITY)
Admission: RE | Admit: 2013-04-08 | Discharge: 2013-04-08 | Disposition: A | Discharge status: Home / Self Care | Payer: Medicaid Other | Source: Ambulatory Visit | Attending: Cardiology | Admitting: Cardiology

## 2013-04-10 ENCOUNTER — Encounter (HOSPITAL_COMMUNITY)
Admission: RE | Admit: 2013-04-10 | Discharge: 2013-04-10 | Disposition: A | Discharge status: Home / Self Care | Payer: Medicaid Other | Source: Ambulatory Visit | Attending: Cardiology | Admitting: Cardiology

## 2013-04-13 ENCOUNTER — Encounter (HOSPITAL_COMMUNITY): Payer: Medicaid Other

## 2013-04-15 ENCOUNTER — Encounter (HOSPITAL_COMMUNITY)
Admission: RE | Admit: 2013-04-15 | Discharge: 2013-04-15 | Disposition: A | Discharge status: Home / Self Care | Payer: Medicaid Other | Source: Ambulatory Visit | Attending: Cardiology | Admitting: Cardiology

## 2013-04-17 ENCOUNTER — Encounter (HOSPITAL_COMMUNITY)
Admission: RE | Admit: 2013-04-17 | Discharge: 2013-04-17 | Disposition: A | Discharge status: Home / Self Care | Payer: Medicaid Other | Source: Ambulatory Visit | Attending: Cardiology | Admitting: Cardiology

## 2013-04-20 ENCOUNTER — Encounter (HOSPITAL_COMMUNITY)
Admission: RE | Admit: 2013-04-20 | Discharge: 2013-04-20 | Disposition: A | Discharge status: Home / Self Care | Payer: Medicaid Other | Source: Ambulatory Visit | Attending: Cardiology | Admitting: Cardiology

## 2013-07-07 NOTE — Addendum Note (Signed)
Encounter addended by: Angelica Pou, RN on: 07/07/2013  2:20 PM<BR>     Documentation filed: Notes Section

## 2013-07-07 NOTE — Progress Notes (Signed)
Cardiac Rehabilitation Program Outcomes Report   Orientation:  02/24/2013 1st week 03/09/2013 Graduate Date:  tbd Discharge Date:  tbd # of sessions completed: 21 (3 times at [facility])  Cardiologist: Hocherine Family MD:  Elspeth Cho Time:  11:00  A.  Exercise Program:  Tolerates exercise @ 3.60 METS for 15 minutes and Walk Test Results:  Pre: Pre walk Test: Resting HR was 84, BP 112/70, O2 99%, RPE 7 and RPD 7, 6 min HR 83, BP 122/90, O2 98%, RPE 11 and RPD 1. Post Hr 69, BP 118/90, O2 100% RPE 7 and RPD 7. Walked 1400 ft at 2.6 mph at 3.0 METS.   B.  Mental Health:  Good mental attitude  C.  Education/Instruction/Skills  Accurately checks own pulse.  Rest:  90  Exercise:154, Knows THR for exercise and Uses Perceived Exertion Scale and/or Dyspnea Scale  Uses Perceived Exertion Scale and/or Dyspnea Scale  D.  Nutrition/Weight Control/Body Composition:  Adherence to prescribed nutrition program: good    E.  Blood Lipids    No results found for this basename: CHOL, HDL, LDLCALC, LDLDIRECT, TRIG, CHOLHDL    F.  Lifestyle Changes:  Making positive lifestyle changes  G.  Symptoms noted with exercise:  Asymptomatic  Report Completed By:  Lelon Huh. Artice Holohan RN   Comments:  This is patients 1st week with [place], Patient had done half of his rehab at [facility] before they closed. His resting HR was 90 and resting BP 102/82, and peak HR was 154 and peak BP was 162/80. He has done very well. A report will follow upon his graduation.

## 2013-07-07 NOTE — Progress Notes (Signed)
Cardiac Rehabilitation Program Outcomes Report   Orientation:  02/24/2013 Graduate Date:  04/20/2013 Discharge Date:  04/20/2013 # of sessions completed: 36 DX: CABGX3  Cardiologist: Antoine Poche Family MD:  Elspeth Cho Time:  11:00  A.  Exercise Program:  Tolerates exercise @ 4.54 METS for 15 minutes and Walk Test Results:  Post: Post Walk Test: Resting HR 74, HR 112/60, O2 97%, RPE 6 and RPD 6, 6 min HR 98, BP102/80, O2 97% and RPE7 and RPD 7, Post HR 82: BP 98/60. O2 97%, RPE 6, and RPD 6. Walked 1600 ft at 3.0 mphat 3.3 METS  B.  Mental Health:  Good mental attitude  C.  Education/Instruction/Skills  Accurately checks own pulse.  Rest:  74  Exercise:  155, Knows THR for exercise and Uses Perceived Exertion Scale and/or Dyspnea Scale  Uses Perceived Exertion Scale and/or Dyspnea Scale  D.  Nutrition/Weight Control/Body Composition:  Adherence to prescribed nutrition program: good    E.  Blood Lipids    No results found for this basename: CHOL, HDL, LDLCALC, LDLDIRECT, TRIG, CHOLHDL    F.  Lifestyle Changes:  Making positive lifestyle changes  G.  Symptoms noted with exercise:  Asymptomatic  Report Completed By:  Lelon Huh. Morse Brueggemann RN   Comments:  This is patients graduation report. He achieved a peak METS of 4.54. His resting HR is 74 and BP is 112/60. His peak BP was 142/70 and HR was 155. He has done well while in rehab.

## 2013-07-07 NOTE — Addendum Note (Signed)
Encounter addended by: Angelica Pou, RN on: 07/07/2013  2:38 PM<BR>     Documentation filed: Clinical Notes

## 2013-07-07 NOTE — Addendum Note (Signed)
Encounter addended by: Angelica Pou, RN on: 07/07/2013  2:21 PM<BR>     Documentation filed: Clinical Notes

## 2013-07-07 NOTE — Addendum Note (Signed)
Encounter addended by: Angelica Pou, RN on: 07/07/2013  2:37 PM<BR>     Documentation filed: Notes Section

## 2013-09-15 ENCOUNTER — Telehealth: Payer: Self-pay | Admitting: Cardiology

## 2013-09-15 NOTE — Telephone Encounter (Signed)
Weight up 20 pounds. Wife states blood pressure now averaging in the 140's/80's-90's, yesterday diastolic highest at 96. Flomax discontinued this week. Wife stated Lisinopril had been d/c secondary to low blood pressure. Still has some at home and was wanting to start patient back on it if appropriate. Per wife his subcapsuler renal hematoma has been resolved. Wife was given an appointment for him 2/20 but she is concerned about the rise in blood pressure. Will forward to Dr Antoine PocheHochrein and Orlie DakinPam F RN. [place] if he wanted to see him tomorrow.

## 2013-09-15 NOTE — Telephone Encounter (Signed)
New Message  Pt wife called states that the pt is experiencing High blood pressures. She states that he was taken off of BP medications during the summer of 2014 and his BP has risen since then. ( No readings provided)  She is requesting a call back to discuss how to move forward.

## 2013-09-15 NOTE — Telephone Encounter (Signed)
I have not seen him in the office in awhile.  I don't see any recent labs.  I would like to make the decision about restarting any meds during a visit.  We can try to move this up with me or APP.

## 2013-09-16 NOTE — Telephone Encounter (Signed)
Looking for an appointment to move pt to.  Will continue to watch for any cancellations.

## 2013-09-18 NOTE — Telephone Encounter (Signed)
Left message for pt to call back - my be able to see him today at 3:15

## 2013-09-18 NOTE — Telephone Encounter (Signed)
Pt unable to come in today.  Will keep appt as scheduled,

## 2013-10-09 ENCOUNTER — Other Ambulatory Visit: Payer: Self-pay | Admitting: Cardiology

## 2013-10-16 ENCOUNTER — Encounter: Payer: Self-pay | Admitting: Cardiology

## 2013-10-16 ENCOUNTER — Ambulatory Visit (INDEPENDENT_AMBULATORY_CARE_PROVIDER_SITE_OTHER): Payer: Medicaid Other | Admitting: Cardiology

## 2013-10-16 ENCOUNTER — Encounter (INDEPENDENT_AMBULATORY_CARE_PROVIDER_SITE_OTHER): Payer: Self-pay

## 2013-10-16 VITALS — BP 114/90 | HR 98 | Ht 68.0 in | Wt 212.0 lb

## 2013-10-16 DIAGNOSIS — E785 Hyperlipidemia, unspecified: Secondary | ICD-10-CM

## 2013-10-16 DIAGNOSIS — R002 Palpitations: Secondary | ICD-10-CM

## 2013-10-16 DIAGNOSIS — I25810 Atherosclerosis of coronary artery bypass graft(s) without angina pectoris: Secondary | ICD-10-CM

## 2013-10-16 MED ORDER — SILDENAFIL CITRATE 50 MG PO TABS: 50.0000 mg | ORAL_TABLET | Freq: Every day | ORAL | Status: DC | PRN

## 2013-10-16 NOTE — Progress Notes (Signed)
HPI The patient presented with an acute myocardial infarction in early January. He was taken to [facility]. Cardiac cath demonstrated an occluded right coronary artery. The LAD had ostial 90% stenosis followed by a mid 95% stenosis. There was a moderate size mid diagonal with 80% stenosis. Ramus intermediate had 30% stenosis. The patient was treated with CABG with LIMA to the LAD, SVG to the ramus intermediate, SVG to the right coronary artery. The EF was 40% preoperatively and 55% postoperatively.   Unfortunately he has not done well since the last saw him. He seems to have anhedonia. He has gained about 30 pounds over several months and is not participating in any activities. He was being treated for depression but stopped this medication as he "didn't do well with it". He seems to be describing some palpitations. He feels a thump in his chest. He's not had any presyncope or syncope with this. This seems to happen daily. He has significant fatigue. He unfortunately has started smoking cigarettes again because of stress. He's not having any chest pain suggestive of angina. He's not having any PND or orthopnea.  Allergies  Allergen Reactions  . Codeine Nausea And Vomiting  . Demerol [Meperidine] Other (See Comments)    Becomes angry   . Dilaudid [Hydromorphone Hcl] Nausea And Vomiting    IV  . Morphine And Related Nausea And Vomiting    Current Outpatient Prescriptions  Medication Sig Dispense Refill  . aspirin EC 81 MG tablet Take 81 mg by mouth 2 (two) times daily.      Marland Kitchen. atorvastatin (LIPITOR) 40 MG tablet TAKE 1 TABLET BY MOUTH EVERY DAY  30 tablet  0  . Cyanocobalamin (VITAMIN B-12 PO) Take by mouth daily.      Marland Kitchen. loratadine (CLARITIN) 10 MG tablet Take 10 mg by mouth daily.       No current facility-administered medications for this visit.    Past Medical History  Diagnosis Date  . Nephrolithiasis   . Coronary artery disease   . Hematoma     Subcapsule renal    Past  Surgical History  Procedure Laterality Date  . Kidney stents    . Coronary artery bypass graft      LIMA to the LAD, SVG to ramus intermediate, SVG to right coronary artery 08/2012  . Rotator cufff      right  . Hemorrhoid surgery      x 2  . Vasectomy    . Inguinal hernia repair      bilateral    ROS:  Decreased energy and ED.  As stated in the HPI and negative for all other systems.  PHYSICAL EXAM BP 114/90  Pulse 98  Ht 5\' 8"  (1.727 m)  Wt 212 lb (96.163 kg)  BMI 32.24 kg/m2 GENERAL:  Well appearing HEENT:  Pupils equal round and reactive, fundi not visualized, oral mucosa unremarkable, bruising on the bridge of his nose NECK:  No jugular venous distention, waveform within normal limits, carotid upstroke brisk and symmetric, no bruits, no thyromegaly LUNGS:  Clear to auscultation bilaterally CHEST:  Well healed sternotomy scar. HEART:  PMI not displaced or sustained,S1 and S2 within normal limits, no S3, no S4, no clicks, no rubs, no murmurs ABD:  Flat, positive bowel sounds normal in frequency in pitch, no bruits, no rebound, no guarding, no midline pulsatile mass, no hepatomegaly, no splenomegaly EXT:  2 plus pulses throughout, no edema, no cyanosis no clubbing  EKG:  Sinus rhythm, rate 98,  RAD, old anteroseptal infarct.  No acute ST T wave changes.  10/16/2013  ASSESSMENT AND PLAN  CAD/CABG The patient will continue the meds as listed with the exceptions listed below  Tobacco abuse - Unfortunately he stopped smoking and I have continued to educate him about this.  Dyslipidemia - Because of his fatigue and generalized failure to thrive I will have him hold his statin for one month to see if he has any improvement in symptoms.  Palpitations- I will place a 24-hour Holter monitor. I did appreciate a sinus arrhythmia. Further evaluation will be based on these results.  ED - He has no contraindications to Viagra or other such meds.    Fatigue - I will order a TSH

## 2013-10-16 NOTE — Patient Instructions (Signed)
Please hold your Lipitor (atrovastatin) Continue all other medications as listed.  Your physician has recommended that you wear a holter monitor. Holter monitors are medical devices that record the heart's electrical activity. Doctors most often use these monitors to diagnose arrhythmias. Arrhythmias are problems with the speed or rhythm of the heartbeat. The monitor is a small, portable device. You can wear one while you do your normal daily activities. This is usually used to diagnose what is causing palpitations/syncope (passing out).  Please having fasting bloodwork at your primary care doctor's office (Lipid/TSH)  Follow up in 6 months with Dr Antoine PocheHochrein.  You will receive a letter in the mail 2 months before you are due.  Please call us when you receive this letter to schedule your follow up appointment.

## 2013-10-21 ENCOUNTER — Ambulatory Visit (INDEPENDENT_AMBULATORY_CARE_PROVIDER_SITE_OTHER): Payer: Medicaid Other | Admitting: *Deleted

## 2013-10-21 DIAGNOSIS — I25810 Atherosclerosis of coronary artery bypass graft(s) without angina pectoris: Secondary | ICD-10-CM

## 2013-10-21 DIAGNOSIS — R002 Palpitations: Secondary | ICD-10-CM

## 2013-10-21 NOTE — Progress Notes (Signed)
24 hour holter monitor placed with instructions.

## 2013-11-09 ENCOUNTER — Telehealth: Payer: Self-pay | Admitting: Cardiology

## 2013-11-09 NOTE — Telephone Encounter (Signed)
Pt aware of results 

## 2013-11-09 NOTE — Telephone Encounter (Signed)
Minimum HR 60 Max 114 - NSR few PVC's.

## 2013-11-09 NOTE — Telephone Encounter (Signed)
New message  Patient would like results of monitor, you can leave a detailed message. Please call and advise.

## 2013-11-17 ENCOUNTER — Emergency Department (HOSPITAL_COMMUNITY): Payer: Medicaid Other

## 2013-11-17 ENCOUNTER — Encounter (HOSPITAL_COMMUNITY): Payer: Self-pay | Admitting: Emergency Medicine

## 2013-11-17 ENCOUNTER — Emergency Department (HOSPITAL_COMMUNITY)
Admission: EM | Admit: 2013-11-17 | Discharge: 2013-11-17 | Disposition: A | Discharge status: Home / Self Care | Payer: Medicaid Other | Attending: Emergency Medicine | Admitting: Emergency Medicine

## 2013-11-17 DIAGNOSIS — Z7982 Long term (current) use of aspirin: Secondary | ICD-10-CM | POA: Insufficient documentation

## 2013-11-17 DIAGNOSIS — Z23 Encounter for immunization: Secondary | ICD-10-CM | POA: Insufficient documentation

## 2013-11-17 DIAGNOSIS — S91009A Unspecified open wound, unspecified ankle, initial encounter: Principal | ICD-10-CM

## 2013-11-17 DIAGNOSIS — Z791 Long term (current) use of non-steroidal anti-inflammatories (NSAID): Secondary | ICD-10-CM | POA: Insufficient documentation

## 2013-11-17 DIAGNOSIS — Z951 Presence of aortocoronary bypass graft: Secondary | ICD-10-CM | POA: Insufficient documentation

## 2013-11-17 DIAGNOSIS — Z87442 Personal history of urinary calculi: Secondary | ICD-10-CM | POA: Insufficient documentation

## 2013-11-17 DIAGNOSIS — Z87891 Personal history of nicotine dependence: Secondary | ICD-10-CM | POA: Insufficient documentation

## 2013-11-17 DIAGNOSIS — S81809A Unspecified open wound, unspecified lower leg, initial encounter: Principal | ICD-10-CM

## 2013-11-17 DIAGNOSIS — Z79899 Other long term (current) drug therapy: Secondary | ICD-10-CM | POA: Insufficient documentation

## 2013-11-17 DIAGNOSIS — Y9389 Activity, other specified: Secondary | ICD-10-CM | POA: Insufficient documentation

## 2013-11-17 DIAGNOSIS — Y929 Unspecified place or not applicable: Secondary | ICD-10-CM | POA: Insufficient documentation

## 2013-11-17 DIAGNOSIS — W298XXA Contact with other powered powered hand tools and household machinery, initial encounter: Secondary | ICD-10-CM | POA: Insufficient documentation

## 2013-11-17 DIAGNOSIS — S81012A Laceration without foreign body, left knee, initial encounter: Secondary | ICD-10-CM

## 2013-11-17 DIAGNOSIS — I2510 Atherosclerotic heart disease of native coronary artery without angina pectoris: Secondary | ICD-10-CM | POA: Insufficient documentation

## 2013-11-17 DIAGNOSIS — S81009A Unspecified open wound, unspecified knee, initial encounter: Secondary | ICD-10-CM | POA: Insufficient documentation

## 2013-11-17 MED ORDER — CEPHALEXIN 500 MG PO CAPS: 500.0000 mg | ORAL_CAPSULE | Freq: Four times a day (QID) | ORAL | Status: DC

## 2013-11-17 MED ORDER — TETANUS-DIPHTH-ACELL PERTUSSIS 5-2.5-18.5 LF-MCG/0.5 IM SUSP
0.5000 mL | Freq: Once | INTRAMUSCULAR | Status: AC
  Administered 2013-11-17: 0.5 mL via INTRAMUSCULAR
  Filled 2013-11-17: qty 0.5

## 2013-11-17 MED ORDER — LIDOCAINE-EPINEPHRINE (PF) 2 %-1:200000 IJ SOLN
INTRAMUSCULAR | Status: AC
  Administered 2013-11-17: 14:00:00
  Filled 2013-11-17: qty 20

## 2013-11-17 NOTE — ED Provider Notes (Signed)
CSN: [id number]     Arrival date & time 11/17/13  1155 History   First MD Initiated Contact with Patient 11/17/13 1204     Chief Complaint  Patient presents with  . Laceration     (Consider location/radiation/quality/duration/timing/severity/associated sxs/prior Treatment) HPI  Patient presents to the emergency department with complaints of laceration to left knee from a chainsaw. He reports that prior snapped back at him, which startled him, which caused him to accidentally touch use the knee with a chain saw. The incident happened 2 hours ago. His wife who is a Engineer, civil (consulting) that he said loss but she cleaned it up at home. The bleeding is now under control and he is able to ambulate without difficulty. He denies having any numbness, weakness or loss of sensation to that leg. Patient reports he was supposed to have a primary care visit to look at his knee today. She reports a little bit nauseous. Otherwise no other complaints and no head or neck injuries.  Past Medical History  Diagnosis Date  . Nephrolithiasis   . Coronary artery disease   . Hematoma     Subcapsule renal   Past Surgical History  Procedure Laterality Date  . Kidney stents    . Coronary artery bypass graft      LIMA to the LAD, SVG to ramus intermediate, SVG to right coronary artery 08/2012  . Rotator cufff      right  . Hemorrhoid surgery      x 2  . Vasectomy    . Inguinal hernia repair      bilateral   Family History  Problem Relation Age of Onset  . Coronary artery disease Father 62  . Heart attack Paternal Grandmother    History  Substance Use Topics  . Smoking status: Former Smoker -- 0.25 packs/day for 25 years    Types: Cigarettes  . Smokeless tobacco: Not on file     Comment: Quit Jan 1st  . Alcohol Use: No    Review of Systems  The patient denies anorexia, fever, weight loss,, vision loss, decreased hearing, hoarseness, chest pain, syncope, dyspnea on exertion, peripheral edema, balance deficits,  hemoptysis, abdominal pain, melena, hematochezia, severe indigestion/heartburn, hematuria, incontinence, genital sores, muscle weakness, suspicious skin lesions, transient blindness, difficulty walking, depression, unusual weight change,, enlarged lymph nodes, angioedema, and breast masses.   Allergies  Codeine; Demerol; Dilaudid; and Morphine and related  Home Medications   Current Outpatient Rx  Name  Route  Sig  Dispense  Refill  . aspirin EC 81 MG tablet   Oral   Take 81 mg by mouth 2 (two) times daily.         . Cyanocobalamin (VITAMIN B-12 PO)   Oral   Take by mouth daily.         . cyclobenzaprine (FLEXERIL) 10 MG tablet   Oral   Take 10 mg by mouth 3 (three) times daily.         Marland Kitchen loratadine (CLARITIN) 10 MG tablet   Oral   Take 10 mg by mouth at bedtime.          . naproxen (NAPROSYN) 500 MG tablet   Oral   Take 500 mg by mouth 2 (two) times daily with a meal.         . atorvastatin (LIPITOR) 40 MG tablet      TAKE 1 TABLET BY MOUTH EVERY DAY   30 tablet   0     Generic WUJ:WJXBJYN  40MG   10/09/2013 5:35:47 PM   . cephALEXin (KEFLEX) 500 MG capsule   Oral   Take 1 capsule (500 mg total) by mouth 4 (four) times daily.   40 capsule   0   . sildenafil (VIAGRA) 50 MG tablet   Oral   Take 1 tablet (50 mg total) by mouth daily as needed for erectile dysfunction.   10 tablet   0     Further refills should come from urologist    BP 117/84  Pulse 93  Temp(Src) 98 F (36.7 C) (Oral)  Resp 18  Ht 5\' 8"  (1.727 m)  Wt 210 lb (95.255 kg)  BMI 31.94 kg/m2  SpO2 98% Physical Exam  Nursing note and vitals reviewed. Constitutional: He appears well-developed and well-nourished. No distress.  HENT:  Head: Normocephalic and atraumatic.  Eyes: Pupils are equal, round, and reactive to light.  Neck: Normal range of motion. Neck supple.  Cardiovascular: Normal rate and regular rhythm.   Pulmonary/Chest: Effort normal.  Abdominal: Soft.    Musculoskeletal:       Left knee: He exhibits swelling and laceration (3 cm laceration overlying the petella cap. It does not extend into the musculature. No FB noted. Pedal pulses are strong and equal bilaterally.). He exhibits normal range of motion. Tenderness found.       Legs: Neurological: He is alert.  Skin: Skin is warm and dry.    ED Course  Procedures (including critical care time) Labs Review Labs Reviewed - No data to display Imaging Review Dg Knee Complete 4 Views Left  11/17/2013   CLINICAL DATA:  Status post laceration anteriorly due to changes all accident  EXAM: LEFT KNEE - COMPLETE 4+ VIEW  COMPARISON:  None.  FINDINGS: Four views of the left knee reveal the bones to be adequately mineralized. There is no evidence of an acute fracture nor dislocation. There are vascular clips in the popliteal fossa. No foreign bodies are demonstrated otherwise. There is no definite joint effusion.  IMPRESSION: There is no acute bony abnormality of the left knee.   Electronically Signed   By: David  [place]   On: 11/17/2013 13:06     EKG Interpretation None      MDM   Final diagnoses:  Laceration of knee, left    Patient given tetanus in the ED Wound is supericial and no FB palpated or seen on exam. Xray does not show any FB or boney abnormalities. Will give Rx due to dirty mechanism. Pt can f/u with PCP.  LACERATION REPAIR Performed by: Dorthula Matas Authorized by: Dorthula Matas Consent: Verbal consent obtained. Risks and benefits: risks, benefits and alternatives were discussed Consent given by: patient Patient identity confirmed: provided demographic data Prepped and Draped in normal sterile fashion Wound explored  Laceration Location: left knee  Laceration Length: 3cm  No Foreign Bodies seen or palpated  Anesthesia: local infiltration  Local anesthetic: lidocaine 2% with epinephrine  Anesthetic total: 4 ml  Irrigation method: syringe Amount of  cleaning: standard  Skin closure: staples  Number of sutures: 4  Technique: staples  Patient tolerance: Patient tolerated the procedure well with no immediate complications.   Rx: keflex - sutures to be removed in 7-10 days  43 y.o.Lionel Woodberry Tisdell's evaluation in the Emergency Department is complete. It has been determined that no acute conditions requiring further emergency intervention are present at this time. The patient/guardian have been advised of the diagnosis and plan. We have discussed signs and symptoms that warrant return  to the ED, such as changes or worsening in symptoms.  Vital signs are stable at discharge. Filed Vitals:   11/17/13 1202  BP: 117/84  Pulse: 93  Temp: 98 F (36.7 C)  Resp: 18    Patient/guardian has voiced understanding and agreed to follow-up with the PCP or specialist.      Dorthula Matasiffany G Helmuth Recupero, PA-C 11/17/13 1317

## 2013-11-17 NOTE — Discharge Instructions (Signed)

## 2013-11-17 NOTE — ED Notes (Signed)
Laceration to left knee from chainsaw.  Bleeding controlled in triage.

## 2013-11-17 NOTE — ED Notes (Signed)
Laceration cleansed, bandaged.

## 2013-11-17 NOTE — ED Notes (Signed)
Laceration to patellar region of lt knee with chain saw app 2 hours ago. No active bleeding.

## 2013-11-18 NOTE — ED Provider Notes (Signed)
Medical screening examination/treatment/procedure(s) were performed by non-physician practitioner and as supervising physician I was immediately available for consultation/collaboration.   EKG Interpretation None        Sukhman Kocher L Stacyann Mcconaughy, MD 11/18/13 1350 

## 2013-11-28 ENCOUNTER — Other Ambulatory Visit: Payer: Self-pay | Admitting: Cardiology

## 2013-12-07 ENCOUNTER — Other Ambulatory Visit (INDEPENDENT_AMBULATORY_CARE_PROVIDER_SITE_OTHER): Payer: Medicaid Other

## 2013-12-07 ENCOUNTER — Ambulatory Visit (INDEPENDENT_AMBULATORY_CARE_PROVIDER_SITE_OTHER): Payer: Medicaid Other | Admitting: Gastroenterology

## 2013-12-07 ENCOUNTER — Encounter: Payer: Self-pay | Admitting: Gastroenterology

## 2013-12-07 VITALS — BP 100/70 | HR 80 | Ht 67.5 in | Wt 208.4 lb

## 2013-12-07 DIAGNOSIS — R9389 Abnormal findings on diagnostic imaging of other specified body structures: Secondary | ICD-10-CM | POA: Diagnosis not present

## 2013-12-07 DIAGNOSIS — R935 Abnormal findings on diagnostic imaging of other abdominal regions, including retroperitoneum: Secondary | ICD-10-CM

## 2013-12-07 DIAGNOSIS — R942 Abnormal results of pulmonary function studies: Secondary | ICD-10-CM | POA: Insufficient documentation

## 2013-12-07 DIAGNOSIS — K219 Gastro-esophageal reflux disease without esophagitis: Secondary | ICD-10-CM | POA: Insufficient documentation

## 2013-12-07 LAB — CBC WITH DIFFERENTIAL/PLATELET
BASOS ABS: 0.1 10*3/uL (ref 0.0–0.1)
Basophils Relative: 0.9 % (ref 0.0–3.0)
EOS ABS: 0.1 10*3/uL (ref 0.0–0.7)
Eosinophils Relative: 1.2 % (ref 0.0–5.0)
HEMATOCRIT: 43.4 % (ref 39.0–52.0)
Hemoglobin: 14.8 g/dL (ref 13.0–17.0)
LYMPHS ABS: 3 10*3/uL (ref 0.7–4.0)
Lymphocytes Relative: 32.1 % (ref 12.0–46.0)
MCHC: 34 g/dL (ref 30.0–36.0)
MCV: 92.8 fl (ref 78.0–100.0)
MONO ABS: 0.6 10*3/uL (ref 0.1–1.0)
Monocytes Relative: 6 % (ref 3.0–12.0)
Neutro Abs: 5.5 10*3/uL (ref 1.4–7.7)
Neutrophils Relative %: 59.8 % (ref 43.0–77.0)
PLATELETS: 279 10*3/uL (ref 150.0–400.0)
RBC: 4.68 Mil/uL (ref 4.22–5.81)
RDW: 13.8 % (ref 11.5–14.6)
WBC: 9.2 10*3/uL (ref 4.5–10.5)

## 2013-12-07 LAB — COMPREHENSIVE METABOLIC PANEL
ALK PHOS: 128 U/L — AB (ref 39–117)
ALT: 40 U/L (ref 0–53)
AST: 31 U/L (ref 0–37)
Albumin: 3.9 g/dL (ref 3.5–5.2)
BILIRUBIN TOTAL: 0.8 mg/dL (ref 0.3–1.2)
BUN: 10 mg/dL (ref 6–23)
CO2: 27 mEq/L (ref 19–32)
Calcium: 9.2 mg/dL (ref 8.4–10.5)
Chloride: 103 mEq/L (ref 96–112)
Creatinine, Ser: 1.1 mg/dL (ref 0.4–1.5)
GFR: 80.5 mL/min (ref >60.00)
Glucose, Bld: 86 mg/dL (ref 70–99)
Potassium: 4.1 mEq/L (ref 3.5–5.1)
Sodium: 138 mEq/L (ref 135–145)
Total Protein: 7.3 g/dL (ref 6.0–8.3)

## 2013-12-07 MED ORDER — PANTOPRAZOLE SODIUM 40 MG PO TBEC: 40.0000 mg | DELAYED_RELEASE_TABLET | Freq: Every day | ORAL | Status: DC

## 2013-12-07 NOTE — Assessment & Plan Note (Signed)
She has frequent pyrosis.  Plan to start Protonix 40 mg daily

## 2013-12-07 NOTE — Assessment & Plan Note (Signed)
By report CT scan shows some thickening of a loop of jejunum raising the question of enteritis.  There are no clinically correlating symptoms.    Recommendations #1 CBC, comprehensive metabolic profile #2 small bowel follow through

## 2013-12-07 NOTE — Patient Instructions (Signed)
You have been scheduled for a small bowel follow thru at [facility]. Your appointment is on 12/09/2013  at 9:30am . Please arrive 15 minutes prior to your test for registration. Make certain not to have anything to eat or drink starting Midnight on the night before your test. If for some reason you need to reschedule your test, please call radiology at [phone]. ____________________________________________________________________ The Small Bowel Follow Thru examination is used to visualize the entire small bowel (intestines); specifically the connection between the small and large intestine. You will be positioned on a flat x-ray table and an image of your abdomen taken. Then the technologist will show the x-ray to the radiologist. The radiologist will instruct your technologist how much (1-2 cups) barium sulfate you will drink and when to begin taking the timed x-rays, usually 15-30 minutes after you begin drinking. Barium is a harmless substance that will highlight your small intestine by absorbing x-ray. The taste is chalky and it feels very heavy both in the cup and in your stomach.  After the first x-ray is taken and shown to the radiologist, he/she will determine when the next image is to be taken. This is repeated until the barium has reached the end of the small intestine and enters the beginning of the colon (cecum). At such time when the barium spills into the colon, you will be positioned on the x-ray table once again. The radiologist will use a fluoroscopic camera to take some detailed pictures of the connection between your small intestine and colon. The fluoroscope is an x-ray unit that works with a television/computer screen. The radiologist will apply pressure to your abdomen with his/her hand and a lead glove, a plastic paddle, or a paddle with an inflated rubber balloon on the end. This is to spread apart your loops of intestine so he/she can see all areas.   This test typically takes around 1 hour  to complete.  Important Drink plenty of water (8-10 cups/day) for a few days following the procedure to avoid constipation and blockage. The barium will make your stools white for a few days.    Go to the basement for labs today Dr Arlyce DiceKaplan is sending in Protonix to your pharmacy for your heartburn ____________________________________________________________________

## 2013-12-07 NOTE — Progress Notes (Addendum)
_                                                                                                                History of Present Illness: 43 year old white male with history of coronary artery disease, hypertension, nephrolithiasis referred for evaluation of an abnormal CT.  Approximately a year ago he underwent lithotripsy for nephro nephrolithiasis.  This was complicated by a subcapsular renal hematoma.  Because of complaints of persistent lower back pain he underwent a CT scan in January, 2015.  Scan is not available for review.  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  The patient has no GI complaints except for occasional pyrosis.  Appetite is excellent and he actually gained 30 pounds in the last year.  He denies abdominal pain, change in bowel habits, melena or hematochezia.  Patient has a history of diverticulitis over 10 years ago.    Past Medical History  Diagnosis Date  . Nephrolithiasis   . Coronary artery disease   . Hematoma     Subcapsule renal  . Heart attack   . PVC (premature ventricular contraction)   . HTN (hypertension)   . Diverticulosis    Past Surgical History  Procedure Laterality Date  . Kidney stents    . Coronary artery bypass graft      LIMA to the LAD, SVG to ramus intermediate, SVG to right coronary artery 08/2012  . Rotator cufff Right   . Hemorrhoid surgery      x 2  . Vasectomy    . Inguinal hernia repair Bilateral   . Lithotripsy      x 3   family history includes Coronary artery disease (age of onset: 5643) in his father; Heart attack in his maternal grandfather; Heart disease in his father; Ovarian cancer in his paternal grandmother; Prostate cancer in his paternal grandfather. Current Outpatient Prescriptions  Medication Sig Dispense Refill  . aspirin EC 81 MG tablet Take 81 mg by mouth 2 (two) times daily.        Marland Kitchen. atorvastatin (LIPITOR) 40 MG tablet TAKE 1 TABLET BY MOUTH EVERY DAY  30 tablet  3  . cephALEXin (KEFLEX) 500 MG capsule Take 1 capsule (500 mg total) by mouth 4 (four) times daily.  40 capsule  0  . Cyanocobalamin (VITAMIN B-12 PO) Take by mouth daily.      . cyclobenzaprine (FLEXERIL) 10 MG tablet Take 10 mg by mouth as needed.       . loratadine (CLARITIN) 10 MG tablet Take 10 mg by mouth at bedtime.       . naproxen (NAPROSYN) 500 MG tablet Take 500 mg by mouth 2 (two) times daily with a meal.       No current facility-administered medications for this visit.   Allergies as of 12/07/2013 - Review Complete 12/07/2013  Allergen Reaction Noted  . Codeine Nausea And Vomiting 10/02/2012  . Demerol [  meperidine] Other (See Comments) 10/02/2012  . Dilaudid [hydromorphone hcl] Nausea And Vomiting 10/02/2012  . Morphine and related Nausea And Vomiting 10/02/2012    reports that he has been smoking Cigarettes.  He has a 6.25 pack-year smoking history. He has never used smokeless tobacco. He reports that he does not drink alcohol or use illicit drugs.     Review of Systems: He has constant lower back pain. Pertinent positive and negative review of systems were noted in the above HPI section. All other review of systems were otherwise negative.  Vital signs were reviewed in today's medical record Physical Exam: General: Well developed , well nourished, no acute distress Skin: anicteric Head: Normocephalic and atraumatic Eyes:  sclerae anicteric, EOMI Ears: Normal auditory acuity Mouth: No deformity or lesions Neck: Supple, no masses or thyromegaly Lungs: Clear throughout to auscultation Heart: Regular rate and rhythm; no murmurs, rubs or bruits Abdomen: Soft, non tender and non distended. No masses, hepatosplenomegaly or hernias noted. Normal Bowel sounds Rectal:deferred Musculoskeletal: Symmetrical with no gross deformities  Skin: No lesions on visible extremities Pulses:  Normal  pulses noted Extremities: No clubbing, cyanosis, edema or deformities noted Neurological: Alert oriented x 4, grossly nonfocal Cervical Nodes:  No significant cervical adenopathy Inguinal Nodes: No significant inguinal adenopathy Psychological:  Alert and cooperative. Normal mood and affect  See Assessment and Plan under Problem List

## 2013-12-09 ENCOUNTER — Ambulatory Visit (HOSPITAL_COMMUNITY)
Admission: RE | Admit: 2013-12-09 | Discharge: 2013-12-09 | Disposition: A | Discharge status: Home / Self Care | Payer: Medicaid Other | Source: Ambulatory Visit | Attending: Gastroenterology | Admitting: Gastroenterology

## 2013-12-09 DIAGNOSIS — R935 Abnormal findings on diagnostic imaging of other abdominal regions, including retroperitoneum: Secondary | ICD-10-CM | POA: Insufficient documentation

## 2013-12-09 DIAGNOSIS — R9389 Abnormal findings on diagnostic imaging of other specified body structures: Secondary | ICD-10-CM

## 2013-12-10 NOTE — Progress Notes (Signed)
Quick Note:  Please inform the patient that small bowel series was normal. Needs no further GI workup ______

## 2014-01-20 ENCOUNTER — Telehealth: Payer: Self-pay | Admitting: Cardiology

## 2014-01-20 NOTE — Telephone Encounter (Signed)
New Message:  Per pt's wife the pt is c/o sob with activity and weight gain. She is requesting to speak with Pam.

## 2014-01-21 NOTE — Telephone Encounter (Signed)
Spoke with wife who is concerned about pt.  She states his weigh continues to go up.  She is concerned he is retaining fluid.  States his ankles are swollen, no has no energy and becomes SOB very easily.  She reports his appetite continues to increase despite stopping the Lipitor.  His clothes are tight.  Advised to keep feet and leg elevated above the level of his heart as much as possible, decrease NA intake as much as possible and avoid all processed, canned and frozen foods.  She states they only eat foods from the garden and the only foods they buy from the grocery store is bread and milk.  Advised to weigh daily (they do not have bathroom scales).  Also advised to see PCP if necessary before his appointment with Dr Antoine Poche on 6/12.  Also recommended that he go to the ED if s/s change or worsen before then.  Dr Antoine Poche is aware of the above.

## 2014-02-05 ENCOUNTER — Ambulatory Visit: Payer: Medicaid Other | Admitting: Cardiology

## 2014-03-25 ENCOUNTER — Other Ambulatory Visit: Payer: Self-pay | Admitting: Cardiology

## 2014-03-26 NOTE — Telephone Encounter (Signed)
Patient was told to hold this at last ov, and I dont see where they were told to restart. Please advise. Thanks, MI

## 2014-04-02 ENCOUNTER — Other Ambulatory Visit: Payer: Self-pay | Admitting: *Deleted

## 2014-04-02 MED ORDER — ATORVASTATIN CALCIUM 40 MG PO TABS: 40.0000 mg | ORAL_TABLET | Freq: Every day | ORAL | Status: DC

## 2014-04-08 ENCOUNTER — Other Ambulatory Visit: Payer: Self-pay | Admitting: *Deleted

## 2014-04-08 MED ORDER — ATORVASTATIN CALCIUM 40 MG PO TABS: 40.0000 mg | ORAL_TABLET | Freq: Every day | ORAL | Status: DC

## 2014-04-08 NOTE — Telephone Encounter (Signed)
Med reordered.

## 2014-04-10 ENCOUNTER — Encounter (HOSPITAL_COMMUNITY): Payer: Self-pay | Admitting: Emergency Medicine

## 2014-04-10 ENCOUNTER — Emergency Department (HOSPITAL_COMMUNITY)
Admission: EM | Admit: 2014-04-10 | Discharge: 2014-04-10 | Disposition: A | Discharge status: Home / Self Care | Payer: Medicaid Other | Attending: Emergency Medicine | Admitting: Emergency Medicine

## 2014-04-10 DIAGNOSIS — S51812A Laceration without foreign body of left forearm, initial encounter: Secondary | ICD-10-CM

## 2014-04-10 DIAGNOSIS — I2510 Atherosclerotic heart disease of native coronary artery without angina pectoris: Secondary | ICD-10-CM | POA: Insufficient documentation

## 2014-04-10 DIAGNOSIS — W260XXA Contact with knife, initial encounter: Secondary | ICD-10-CM | POA: Insufficient documentation

## 2014-04-10 DIAGNOSIS — Z87442 Personal history of urinary calculi: Secondary | ICD-10-CM | POA: Insufficient documentation

## 2014-04-10 DIAGNOSIS — F17200 Nicotine dependence, unspecified, uncomplicated: Secondary | ICD-10-CM | POA: Diagnosis not present

## 2014-04-10 DIAGNOSIS — Z79899 Other long term (current) drug therapy: Secondary | ICD-10-CM | POA: Insufficient documentation

## 2014-04-10 DIAGNOSIS — Z7982 Long term (current) use of aspirin: Secondary | ICD-10-CM | POA: Insufficient documentation

## 2014-04-10 DIAGNOSIS — I10 Essential (primary) hypertension: Secondary | ICD-10-CM | POA: Insufficient documentation

## 2014-04-10 DIAGNOSIS — Z791 Long term (current) use of non-steroidal anti-inflammatories (NSAID): Secondary | ICD-10-CM | POA: Insufficient documentation

## 2014-04-10 DIAGNOSIS — Z951 Presence of aortocoronary bypass graft: Secondary | ICD-10-CM | POA: Diagnosis not present

## 2014-04-10 DIAGNOSIS — S51809A Unspecified open wound of unspecified forearm, initial encounter: Secondary | ICD-10-CM | POA: Diagnosis present

## 2014-04-10 DIAGNOSIS — Z8719 Personal history of other diseases of the digestive system: Secondary | ICD-10-CM | POA: Diagnosis not present

## 2014-04-10 DIAGNOSIS — Z792 Long term (current) use of antibiotics: Secondary | ICD-10-CM | POA: Insufficient documentation

## 2014-04-10 DIAGNOSIS — Y9389 Activity, other specified: Secondary | ICD-10-CM | POA: Insufficient documentation

## 2014-04-10 DIAGNOSIS — W261XXA Contact with sword or dagger, initial encounter: Secondary | ICD-10-CM

## 2014-04-10 DIAGNOSIS — Y929 Unspecified place or not applicable: Secondary | ICD-10-CM | POA: Insufficient documentation

## 2014-04-10 MED ORDER — LIDOCAINE-EPINEPHRINE 1 %-1:100000 IJ SOLN
10.0000 mL | Freq: Once | INTRAMUSCULAR | Status: AC
  Administered 2014-04-10: 10 mL
  Filled 2014-04-10: qty 1

## 2014-04-10 NOTE — ED Provider Notes (Signed)
CSN: [id number]     Arrival date & time 04/10/14  2056 History  This chart was scribed for Santiago GladHeather Takeshi Teasdale, PA-C, working with Samuel JesterKathleen McManus, DO by Chestine SporeSoijett Blue, ED Scribe. The patient was seen in room TR06C/TR06C at 9:49 PM.    Chief Complaint  Patient presents with  . Laceration      The history is provided by the patient. No language interpreter was used.   HPI Comments: Juan Wade is a 43 y.o. male who presents to the Emergency Department complaining of laceration onset 4 PM. He states that he was using a knife when he was trying to cut something. He states that he pulled the knife out and he cut his left forearm. He states that he is having associated symptoms of tingling. He states that he has not tried any medications for his symptoms. He denies fever, chills, and any other associated symptoms. He states that his last Tdap was when he had a lac to his knife that he was seen at [facility] for within the last six months.   Past Medical History  Diagnosis Date  . Nephrolithiasis   . Coronary artery disease   . Hematoma     Subcapsule renal  . Heart attack   . PVC (premature ventricular contraction)   . HTN (hypertension)   . Diverticulosis    Past Surgical History  Procedure Laterality Date  . Kidney stents    . Coronary artery bypass graft      LIMA to the LAD, SVG to ramus intermediate, SVG to right coronary artery 08/2012  . Rotator cufff Right   . Hemorrhoid surgery      x 2  . Vasectomy    . Inguinal hernia repair Bilateral   . Lithotripsy      x 3   Family History  Problem Relation Age of Onset  . Coronary artery disease Father 1543  . Heart attack Maternal Grandfather   . Prostate cancer Paternal Grandfather   . Ovarian cancer Paternal Grandmother     mets  . Heart disease Father    History  Substance Use Topics  . Smoking status: Current Every Day Smoker -- 0.25 packs/day for 25 years    Types: Cigarettes  . Smokeless tobacco: Never Used  . Alcohol Use:  No    Review of Systems  Constitutional: Negative for fever and chills.  Skin: Positive for wound (left forearm).  Neurological:       Tingling of the left forearm      Allergies  Codeine; Demerol; Dilaudid; and Morphine and related  Home Medications   Prior to Admission medications   Medication Sig Start Date End Date Taking? Authorizing Provider  aspirin EC 81 MG tablet Take 81 mg by mouth 2 (two) times daily. 10/02/12   Rollene RotundaJames Hochrein, MD  atorvastatin (LIPITOR) 40 MG tablet TAKE 1 TABLET BY MOUTH EVERY DAY 04/08/14   Rollene RotundaJames Hochrein, MD  atorvastatin (LIPITOR) 40 MG tablet Take 1 tablet (40 mg total) by mouth daily at 6 PM. 04/08/14   Rollene RotundaJames Hochrein, MD  cephALEXin (KEFLEX) 500 MG capsule Take 1 capsule (500 mg total) by mouth 4 (four) times daily. 11/17/13   Tiffany Irine SealG Greene, PA-C  Cyanocobalamin (VITAMIN B-12 PO) Take by mouth daily.    Historical Provider, MD  cyclobenzaprine (FLEXERIL) 10 MG tablet Take 10 mg by mouth as needed.     Historical Provider, MD  loratadine (CLARITIN) 10 MG tablet Take 10 mg by mouth at bedtime.  Historical Provider, MD  naproxen (NAPROSYN) 500 MG tablet Take 500 mg by mouth 2 (two) times daily with a meal.    Historical Provider, MD  pantoprazole (PROTONIX) 40 MG tablet Take 1 tablet (40 mg total) by mouth daily. 12/07/13   Louis Meckel, MD   BP 154/103  Pulse 88  Temp(Src) 97.7 F (36.5 C) (Oral)  Resp 18  SpO2 96%  Physical Exam  Nursing note and vitals reviewed. Constitutional: He is oriented to person, place, and time. He appears well-developed and well-nourished. No distress.  HENT:  Head: Normocephalic and atraumatic.  Eyes: EOM are normal.  Neck: Neck supple. No tracheal deviation present.  Cardiovascular: Normal rate, regular rhythm and normal heart sounds.   Pulses:      Radial pulses are 2+ on the left side.  Distal sensation of all fingers of the left hand are intact.   Pulmonary/Chest: Effort normal and breath sounds  normal. No respiratory distress.  Musculoskeletal: Normal range of motion.  Neurological: He is alert and oriented to person, place, and time.  Skin: Skin is warm and dry.  2 cm linear laceration of the volar aspect of the left forearm. Good muscle strength of the wrist. Full ROM of the left wrist and left elbow.   Psychiatric: He has a normal mood and affect. His behavior is normal.    ED Course  Procedures (including critical care time) DIAGNOSTIC STUDIES: Oxygen Saturation is 96% on room air, normal by my interpretation.    COORDINATION OF CARE: 9:54 PM-Discussed treatment plan which includes laceration repair with pt at bedside and pt agreed to plan.   10:30 PM-Laceration repair consisted of 4-0 prolene and 5 sutures were placed.   Labs Review Labs Reviewed - No data to display  Imaging Review No results found.   EKG Interpretation None      MDM   Final diagnoses:  None   Patient with a laceration to his forearm.  Bottom of laceration clearly visualized.  No foreign body visualized.  Laceration irrigated well and repaired with sutures without difficulty.  Patient is neurovascularly intact.  Good muscle strength of wrist.  He reports that his tetanus is UTD.  Patient stable for discharge.  Return precautions given.  I personally performed the services described in this documentation, which was scribed in my presence. The recorded information has been reviewed and is accurate.    Santiago Glad, PA-C 04/12/14 0100

## 2014-04-10 NOTE — ED Notes (Signed)
Pt has laceration to left forearm from knife. Bleeding contolled with pressure bandage. NAD.

## 2014-04-12 NOTE — ED Provider Notes (Signed)
Medical screening examination/treatment/procedure(s) were performed by non-physician practitioner and as supervising physician I was immediately available for consultation/collaboration.   EKG Interpretation None        Lilyth Lawyer, DO 04/12/14 1547 

## 2014-04-26 ENCOUNTER — Other Ambulatory Visit: Payer: Self-pay | Admitting: Gastroenterology

## 2014-07-15 ENCOUNTER — Telehealth: Payer: Self-pay | Admitting: Cardiology

## 2014-07-15 NOTE — Telephone Encounter (Signed)
New Message  Pt requests to see Dr. Katrinka BlazingSmith. Pt reports he was seen previously by Dr. Katrinka BlazingSmith who knows his family history. Is it ok to switch providers.

## 2014-07-15 NOTE — Telephone Encounter (Signed)
Sure

## 2014-07-16 NOTE — Telephone Encounter (Signed)
Pt. Called and informed that it could take a couple of weeks to make that switch but we start working on it

## 2014-08-23 ENCOUNTER — Other Ambulatory Visit: Payer: Self-pay | Admitting: Cardiology

## 2014-08-23 ENCOUNTER — Other Ambulatory Visit: Payer: Self-pay | Admitting: Gastroenterology

## 2014-08-27 ENCOUNTER — Other Ambulatory Visit: Payer: Self-pay | Admitting: Gastroenterology

## 2014-10-21 ENCOUNTER — Other Ambulatory Visit: Payer: Self-pay | Admitting: Cardiology

## 2014-10-29 ENCOUNTER — Telehealth: Payer: Self-pay

## 2014-10-29 NOTE — Telephone Encounter (Signed)
10/29/14 Received 2nd Disc from [facility] filed on shelf Jeneen Rinks/[facility]

## 2014-10-29 NOTE — Telephone Encounter (Signed)
10/29/14 [facility] and filed [facility]en Rinks[facility]

## 2014-12-14 ENCOUNTER — Ambulatory Visit (INDEPENDENT_AMBULATORY_CARE_PROVIDER_SITE_OTHER): Payer: Medicaid Other | Admitting: Interventional Cardiology

## 2014-12-14 ENCOUNTER — Encounter: Payer: Self-pay | Admitting: Interventional Cardiology

## 2014-12-14 VITALS — BP 100/70 | HR 89 | Ht 68.0 in | Wt 201.4 lb

## 2014-12-14 DIAGNOSIS — R002 Palpitations: Secondary | ICD-10-CM | POA: Diagnosis not present

## 2014-12-14 DIAGNOSIS — E785 Hyperlipidemia, unspecified: Secondary | ICD-10-CM | POA: Diagnosis not present

## 2014-12-14 DIAGNOSIS — I5022 Chronic systolic (congestive) heart failure: Secondary | ICD-10-CM

## 2014-12-14 DIAGNOSIS — I5020 Unspecified systolic (congestive) heart failure: Secondary | ICD-10-CM | POA: Insufficient documentation

## 2014-12-14 DIAGNOSIS — I25812 Atherosclerosis of bypass graft of coronary artery of transplanted heart without angina pectoris: Secondary | ICD-10-CM | POA: Diagnosis not present

## 2014-12-14 NOTE — Progress Notes (Signed)
Cardiology Office Note   Date:  12/14/2014   ID:  Juan ServiceJames M Ingwersen, DOB 21-Oct-1970, MRN [id number]  PCP:  Juliette AlcideBURDINE,STEVEN E, MD  Cardiologist:   Lesleigh NoeSMITH III,HENRY W, MD   Chief Complaint  Patient presents with  . Coronary Artery Disease      History of Present Illness: Juan Wade is a 44 y.o. male who presents for emergency coronary bypass surgery, anterior myocardial infarction presurgery, hyperlipidemia, tobacco abuse,and strong family history of coronary artery disease.  The patient will last seen by me in 2011 and had a normal myocardial perfusion study.  In 2014 he had an acute coronary syndrome, presenting with an anterior myocardial infarction. He ultimately underwent bypass surgery with LIMA to LAD, SVG to ramus intermedius, and SVG to RCA. He states that after surgery he had low blood pressure for quite some time and that medications use following surgery were discontinued because of hypotension.  The patient now has exertional dyspnea and is here to establish longitudinal care. He has pinching left subclavicular and sub-pectoral chest discomfort that lasts seconds and resolves. It   Past Medical History  Diagnosis Date  . Nephrolithiasis   . Coronary artery disease   . Hematoma     Subcapsule renal  . Heart attack   . PVC (premature ventricular contraction)   . HTN (hypertension)   . Diverticulosis   . Hyperlipidemia     Past Surgical History  Procedure Laterality Date  . Kidney stents    . Coronary artery bypass graft      LIMA to the LAD, SVG to ramus intermediate, SVG to right coronary artery 08/2012  . Rotator cufff Right   . Hemorrhoid surgery      x 2  . Vasectomy    . Inguinal hernia repair Bilateral   . Lithotripsy      x 3     Current Outpatient Prescriptions  Medication Sig Dispense Refill  . aspirin EC 81 MG tablet Take 81 mg by mouth 2 (two) times daily.    Marland Kitchen. atorvastatin (LIPITOR) 40 MG tablet TAKE 1 TABLET BY MOUTH EVERY DAY 30 tablet 1    . cyclobenzaprine (FLEXERIL) 10 MG tablet Take 10 mg by mouth as needed.     . loratadine (CLARITIN) 10 MG tablet Take 10 mg by mouth at bedtime.     . pantoprazole (PROTONIX) 40 MG tablet TAKE 1 TABLET BY MOUTH DAILY 30 tablet 3  . pantoprazole (PROTONIX) 40 MG tablet TAKE 1 TABLET BY MOUTH DAILY 30 tablet 3   No current facility-administered medications for this visit.    Allergies:   Codeine; Demerol; Dilaudid; and Morphine and related    Social History:  The patient  reports that he has been smoking Cigarettes.  He has a 6.25 pack-year smoking history. He has never used smokeless tobacco. He reports that he does not drink alcohol or use illicit drugs.   Family History:  The patient's  Occurs spontaneously  family history includes Coronary artery disease (age of onset: 6043) in his father; Heart attack in his maternal grandfather; Heart disease in his father; Ovarian cancer in his paternal grandmother; Prostate cancer in his paternal grandfather.    ROS:  Please see the history of present illness.   Otherwise, review of systems are positive for TIMI of cigarette use, cough, and weight gain..   All other systems are reviewed and negative.    PHYSICAL EXAM: VS:  BP 100/70 mmHg  Pulse 89  Ht   (1.727 m)  Wt 201 lb 6.4 oz (91.354 kg)  BMI 30.63 kg/m2 , BMI Body mass index is 30.63 kg/(m^2). GEN: Well nourished, well developed, in no acute distress HEENT: normal Neck: no JVD, carotid bruits, or masses Cardiac: RRR; no murmurs, rubs, or gallops,no edema  Respiratory:  clear to auscultation bilaterally, normal work of breathing GI: soft, nontender, nondistended, + BS MS: no deformity or atrophy Skin: warm and dry, no rash Neuro:  Strength and sensation are intact Psych: euthymic mood, full affect   EKG:  EKG is ordered today. The ekg ordered today demonstrates normal sinus rhythm with anteroseptal infarction with Q waves V1 through V4.   Recent Labs: No results found for  requested labs within last 365 days.    Lipid Panel No results found for: CHOL, TRIG, HDL, CHOLHDL, VLDL, LDLCALC, LDLDIRECT    Wt Readings from Last 3 Encounters:  12/14/14 201 lb 6.4 oz (91.354 kg)  12/07/13 208 lb 6 oz (94.518 kg)  11/17/13 210 lb (95.255 kg)      Other studies Reviewed: Additional studies/ records that were reviewed today include: records from primary care physician. 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 LVEF never established post surgery.Plan: 2D Echocardiogram without contrast     Current medicines are reviewed at length with the patient today.  The patient does not have concerns regarding medicines.  The following changes have been made:  no change. We will plan to make medication adjustments as needed based upon the patient's LV systolic function.  Labs/ tests ordered today include:   Orders Placed This Encounter  Procedures  . 2D Echocardiogram without contrast     Disposition:   FU with H. Smit in 6 weeks   Signed, Lesleigh Noe, MD  12/14/2014 3:35 PM    [facility] [address] [place], [place], [place]  [address] Phone: [phone]; Fax: [phone]

## 2014-12-14 NOTE — Patient Instructions (Signed)
Medication Instructions:  Your physician recommends that you continue on your current medications as directed. Please refer to the Current Medication list given to you today.   Labwork: None   Testing/Procedures: Your physician has requested that you have an echocardiogram. Echocardiography is a painless test that uses sound waves to create images of your heart. It provides your doctor with information about the size and shape of your heart and how well your heart's chambers and valves are working. This procedure takes approximately one hour. There are no restrictions for this procedure.  Follow-Up: Your physician recommends that you schedule a follow-up appointment in: 6 weeks   Any Other Special Instructions Will Be Listed Below (If Applicable).

## 2014-12-17 ENCOUNTER — Ambulatory Visit (HOSPITAL_COMMUNITY): Payer: Medicaid Other | Attending: Interventional Cardiology | Admitting: Radiology

## 2014-12-17 DIAGNOSIS — Z720 Tobacco use: Secondary | ICD-10-CM | POA: Diagnosis not present

## 2014-12-17 DIAGNOSIS — E785 Hyperlipidemia, unspecified: Secondary | ICD-10-CM | POA: Diagnosis not present

## 2014-12-17 DIAGNOSIS — I5022 Chronic systolic (congestive) heart failure: Secondary | ICD-10-CM | POA: Insufficient documentation

## 2014-12-17 DIAGNOSIS — E669 Obesity, unspecified: Secondary | ICD-10-CM | POA: Diagnosis not present

## 2014-12-17 NOTE — Progress Notes (Signed)
Echocardiogram performed.  

## 2014-12-20 ENCOUNTER — Other Ambulatory Visit: Payer: Self-pay | Admitting: Cardiology

## 2014-12-20 ENCOUNTER — Encounter: Payer: Self-pay | Admitting: Interventional Cardiology

## 2014-12-20 ENCOUNTER — Other Ambulatory Visit: Payer: Self-pay | Admitting: Gastroenterology

## 2014-12-20 MED ORDER — PROTAMINE SULFATE 10 MG/ML IV SOLN
INTRAVENOUS | Status: AC
  Filled 2014-12-20: qty 25

## 2014-12-20 MED ORDER — PROPOFOL 10 MG/ML IV BOLUS
INTRAVENOUS | Status: AC
  Filled 2014-12-20: qty 20

## 2014-12-20 MED ORDER — DEXMEDETOMIDINE HCL IN NACL 200 MCG/50ML IV SOLN
INTRAVENOUS | Status: AC
  Filled 2014-12-20: qty 50

## 2014-12-20 MED ORDER — ESMOLOL HCL 10 MG/ML IV SOLN
INTRAVENOUS | Status: AC
  Filled 2014-12-20: qty 10

## 2014-12-20 MED ORDER — MIDAZOLAM HCL 10 MG/2ML IJ SOLN
INTRAMUSCULAR | Status: AC
  Filled 2014-12-20: qty 2

## 2014-12-20 MED ORDER — HYDRALAZINE HCL 20 MG/ML IJ SOLN
INTRAMUSCULAR | Status: AC
  Filled 2014-12-20: qty 2

## 2014-12-20 MED ORDER — CALCIUM CHLORIDE 10 % IV SOLN
INTRAVENOUS | Status: AC
  Filled 2014-12-20: qty 10

## 2014-12-20 MED ORDER — HEPARIN SODIUM (PORCINE) 1000 UNIT/ML IJ SOLN
INTRAMUSCULAR | Status: AC
  Filled 2014-12-20: qty 1

## 2014-12-20 MED ORDER — ROCURONIUM BROMIDE 50 MG/5ML IV SOLN
INTRAVENOUS | Status: AC
  Filled 2014-12-20: qty 1

## 2014-12-20 MED ORDER — FENTANYL CITRATE (PF) 250 MCG/5ML IJ SOLN
INTRAMUSCULAR | Status: AC
  Filled 2014-12-20: qty 5

## 2014-12-20 MED ORDER — SODIUM BICARBONATE 8.4 % IV SOLN
INTRAVENOUS | Status: AC
  Filled 2014-12-20: qty 50

## 2014-12-20 MED ORDER — DIPHENHYDRAMINE HCL 50 MG/ML IJ SOLN
INTRAMUSCULAR | Status: AC
  Filled 2014-12-20: qty 1

## 2014-12-20 MED ORDER — HYDROCORTISONE NA SUCCINATE PF 100 MG IJ SOLR
INTRAMUSCULAR | Status: AC
  Filled 2014-12-20: qty 2

## 2014-12-20 MED ORDER — SUCCINYLCHOLINE CHLORIDE 20 MG/ML IJ SOLN
INTRAMUSCULAR | Status: AC
  Filled 2014-12-20: qty 1

## 2014-12-20 MED ORDER — PHENYLEPHRINE 40 MCG/ML (10ML) SYRINGE FOR IV PUSH (FOR BLOOD PRESSURE SUPPORT)
PREFILLED_SYRINGE | INTRAVENOUS | Status: AC
  Filled 2014-12-20: qty 10

## 2014-12-20 MED ORDER — EPHEDRINE SULFATE 50 MG/ML IJ SOLN
INTRAMUSCULAR | Status: AC
  Filled 2014-12-20: qty 1

## 2014-12-20 MED ORDER — STERILE WATER FOR INJECTION IJ SOLN
INTRAMUSCULAR | Status: AC
  Filled 2014-12-20: qty 20

## 2014-12-20 MED ORDER — ROCURONIUM BROMIDE 50 MG/5ML IV SOLN
INTRAVENOUS | Status: AC
  Filled 2014-12-20: qty 4

## 2014-12-20 MED ORDER — PROTAMINE SULFATE 10 MG/ML IV SOLN
INTRAVENOUS | Status: AC
  Filled 2014-12-20: qty 5

## 2014-12-20 MED ORDER — EPINEPHRINE HCL 0.1 MG/ML IJ SOSY
PREFILLED_SYRINGE | INTRAMUSCULAR | Status: AC
  Filled 2014-12-20: qty 10

## 2014-12-20 MED ORDER — ARTIFICIAL TEARS OP OINT
TOPICAL_OINTMENT | OPHTHALMIC | Status: AC
  Filled 2014-12-20: qty 3.5

## 2014-12-20 MED ORDER — ESMOLOL HCL 10 MG/ML IV SOLN
INTRAVENOUS | Status: AC
Start: 2014-12-20 — End: 2014-12-20
  Filled 2014-12-20: qty 10

## 2014-12-23 ENCOUNTER — Telehealth: Payer: Self-pay

## 2014-12-23 NOTE — Telephone Encounter (Signed)
-----   Message from Lyn RecordsHenry W Smith, MD sent at 12/20/2014  5:33 PM EDT ----- Please let the patient know that his heart size and function are normal, therefore, the need for medications is minimal. Cholesterol management is the main item.

## 2014-12-23 NOTE — Telephone Encounter (Signed)
Pt aware of echo results. his heart size and function are normal, therefore, the need for medications is minimal. Cholesterol management is the main item.  Pt verbalized understanding.

## 2015-01-27 ENCOUNTER — Ambulatory Visit (INDEPENDENT_AMBULATORY_CARE_PROVIDER_SITE_OTHER): Payer: Medicaid Other | Admitting: Interventional Cardiology

## 2015-01-27 ENCOUNTER — Encounter: Payer: Self-pay | Admitting: Interventional Cardiology

## 2015-01-27 VITALS — BP 108/84 | HR 102 | Ht 68.0 in | Wt 195.8 lb

## 2015-01-27 DIAGNOSIS — E785 Hyperlipidemia, unspecified: Secondary | ICD-10-CM | POA: Diagnosis not present

## 2015-01-27 DIAGNOSIS — R0683 Snoring: Secondary | ICD-10-CM | POA: Diagnosis not present

## 2015-01-27 DIAGNOSIS — I25812 Atherosclerosis of bypass graft of coronary artery of transplanted heart without angina pectoris: Secondary | ICD-10-CM

## 2015-01-27 DIAGNOSIS — I5020 Unspecified systolic (congestive) heart failure: Secondary | ICD-10-CM

## 2015-01-27 DIAGNOSIS — I509 Heart failure, unspecified: Secondary | ICD-10-CM

## 2015-01-27 NOTE — Progress Notes (Addendum)
Cardiology Office Note   Date:  01/27/2015   ID:  Juan Wade, DOB 07-02-1971, MRN [id number]  PCP:  Juliette AlcideBURDINE,STEVEN E, MD  Cardiologist:  Lesleigh NoeSMITH III,Arien Morine W, MD   Chief Complaint  Patient presents with  . CAD OF ARTERY BYPASS GRAFT      History of Present Illness: Juan Wade is a 44 y.o. male who presents for CAD, CABG, and CHF (HFrEF) improved to EF 65%.  The patient has fatigue. His wife says that he snores. He does not have angina. He denies shortness of breath. He doesn't have energy to do things that he feels are necessary. His primary care physician is in [place]. He has had laboratory data we just I'll have any here. We discuss results of his recent echo which demonstrates that his LVEF is now 60-65%.    Past Medical History  Diagnosis Date  . Nephrolithiasis   . Coronary artery disease   . Hematoma     Subcapsule renal  . Heart attack   . PVC (premature ventricular contraction)   . HTN (hypertension)   . Diverticulosis   . Hyperlipidemia     Past Surgical History  Procedure Laterality Date  . Kidney stents    . Coronary artery bypass graft      LIMA to the LAD, SVG to ramus intermediate, SVG to right coronary artery 08/2012  . Rotator cufff Right   . Hemorrhoid surgery      x 2  . Vasectomy    . Inguinal hernia repair Bilateral   . Lithotripsy      x 3     Current Outpatient Prescriptions  Medication Sig Dispense Refill  . aspirin EC 81 MG tablet Take 81 mg by mouth daily.    Marland Kitchen. atorvastatin (LIPITOR) 40 MG tablet Take 1 tablet (40 mg total) by mouth daily. 30 tablet 1  . cyclobenzaprine (FLEXERIL) 10 MG tablet Take 10 mg by mouth as needed for muscle spasms.     Marland Kitchen. loratadine (CLARITIN) 10 MG tablet Take 10 mg by mouth at bedtime.     . pantoprazole (PROTONIX) 40 MG tablet TAKE 1 TABLET BY MOUTH DAILY 30 tablet 3  . Tamsulosin HCl (FLOMAX PO) Take 1 capsule by mouth daily.     No current facility-administered medications for this visit.     Allergies:   Codeine; Demerol; Dilaudid; and Morphine and related    Social History:  The patient  reports that he has been smoking Cigarettes.  He has a 6.25 pack-year smoking history. He has never used smokeless tobacco. He reports that he does not drink alcohol or use illicit drugs.   Family History:  The patient's family history includes Arthritis/Rheumatoid in his mother; COPD in his mother; Coronary artery disease (age of onset: 2043) in his father; Emphysema in his mother; Healthy in his brother; Heart attack in his maternal grandfather; Heart disease in his father; Hypertension in his mother; Liver disease in his brother; Neuropathy in his brother; Ovarian cancer in his paternal grandmother; Prostate cancer in his paternal grandfather.    ROS:  Please see the history of present illness.   Otherwise, review of systems are positive for orthostatic dizziness.   All other systems are reviewed and negative.    PHYSICAL EXAM: VS:  BP 108/84 mmHg  Pulse 102  Ht 5\' 8"  (1.727 m)  Wt 195 lb 12.8 oz (88.814 kg)  BMI 29.78 kg/m2  SpO2 98% , no orthostasis is noted. BMI Body  mass index is 29.78 kg/(m^2). GEN: Well nourished, well developed, in no acute distress HEENT: normal Neck: no JVD, carotid bruits, or masses Cardiac: RRR; no murmurs, rubs, or gallops,no edema  Respiratory:  clear to auscultation bilaterally, normal work of breathing GI: soft, nontender, nondistended, + BS MS: no deformity or atrophy Skin: warm and dry, no rash Neuro:  Strength and sensation are intact Psych: euthymic mood, full affect   EKG:  EKG is not ordered today   Recent Labs: No results found for requested labs within last 365 days.    Lipid Panel No results found for: CHOL, TRIG, HDL, CHOLHDL, VLDL, LDLCALC, LDLDIRECT    Wt Readings from Last 3 Encounters:  01/27/15 195 lb 12.8 oz (88.814 kg)  12/14/14 201 lb 6.4 oz (91.354 kg)  12/07/13 208 lb 6 oz (94.518 kg)      Other studies  Reviewed: Additional studies/ records that were reviewed today include: .   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 We need to obtain copies of recent blood work from Dr. Leandrew Koyanagi  Snoring and fatigue- needs a sleep study performed   Current medicines are reviewed at length with the patient today.  The patient does not have concerns regarding medicines.  The following changes have been made:  Decrease aspirin 81 mg daily. Increase aerobic activity  Labs/ tests ordered today include:   Orders Placed This Encounter  Procedures  . Split night study     Disposition:   FU with HS in 1 year  Signed, Lesleigh Noe, MD  01/27/2015 5:17 PM    [facility] [address] [place], [place], [place]  [address] Phone: [phone]; Fax: [phone]

## 2015-01-27 NOTE — Patient Instructions (Addendum)
Medication Instructions:  DECREASE Aspirin to 81mg  daily  Labwork: None   Testing/Procedures: Your physician has recommended that you have a sleep study. This test records several body functions during sleep, including: brain activity, eye movement, oxygen and carbon dioxide blood levels, heart rate and rhythm, breathing rate and rhythm, the flow of air through your mouth and nose, snoring, body muscle movements, and chest and belly movement.   Follow-Up: Your physician wants you to follow-up in: 1 year with Dr.Smith You will receive a reminder letter in the mail two months in advance. If you don't receive a letter, please call our office to schedule the follow-up appointment.   Any Other Special Instructions Will Be Listed Below (If Applicable). Your physician discussed the importance of regular exercise and recommended that you start or continue a regular exercise program for good health.

## 2015-02-18 ENCOUNTER — Other Ambulatory Visit: Payer: Self-pay | Admitting: Cardiology

## 2015-03-28 ENCOUNTER — Emergency Department (HOSPITAL_COMMUNITY)
Admission: EM | Admit: 2015-03-28 | Discharge: 2015-03-28 | Disposition: A | Discharge status: Home / Self Care | Payer: Medicaid Other | Attending: Emergency Medicine | Admitting: Emergency Medicine

## 2015-03-28 ENCOUNTER — Encounter (HOSPITAL_COMMUNITY): Payer: Self-pay | Admitting: Emergency Medicine

## 2015-03-28 ENCOUNTER — Emergency Department (HOSPITAL_COMMUNITY): Payer: Medicaid Other

## 2015-03-28 DIAGNOSIS — R519 Headache, unspecified: Secondary | ICD-10-CM

## 2015-03-28 DIAGNOSIS — Z8719 Personal history of other diseases of the digestive system: Secondary | ICD-10-CM | POA: Diagnosis not present

## 2015-03-28 DIAGNOSIS — I2510 Atherosclerotic heart disease of native coronary artery without angina pectoris: Secondary | ICD-10-CM | POA: Diagnosis not present

## 2015-03-28 DIAGNOSIS — I252 Old myocardial infarction: Secondary | ICD-10-CM | POA: Diagnosis not present

## 2015-03-28 DIAGNOSIS — I10 Essential (primary) hypertension: Secondary | ICD-10-CM | POA: Insufficient documentation

## 2015-03-28 DIAGNOSIS — Z87442 Personal history of urinary calculi: Secondary | ICD-10-CM | POA: Diagnosis not present

## 2015-03-28 DIAGNOSIS — M542 Cervicalgia: Secondary | ICD-10-CM | POA: Insufficient documentation

## 2015-03-28 DIAGNOSIS — E785 Hyperlipidemia, unspecified: Secondary | ICD-10-CM | POA: Diagnosis not present

## 2015-03-28 DIAGNOSIS — R110 Nausea: Secondary | ICD-10-CM | POA: Insufficient documentation

## 2015-03-28 DIAGNOSIS — Z720 Tobacco use: Secondary | ICD-10-CM | POA: Insufficient documentation

## 2015-03-28 DIAGNOSIS — Z951 Presence of aortocoronary bypass graft: Secondary | ICD-10-CM | POA: Diagnosis not present

## 2015-03-28 DIAGNOSIS — M7989 Other specified soft tissue disorders: Secondary | ICD-10-CM | POA: Insufficient documentation

## 2015-03-28 DIAGNOSIS — R079 Chest pain, unspecified: Secondary | ICD-10-CM | POA: Insufficient documentation

## 2015-03-28 DIAGNOSIS — R51 Headache: Secondary | ICD-10-CM | POA: Diagnosis not present

## 2015-03-28 DIAGNOSIS — Z79899 Other long term (current) drug therapy: Secondary | ICD-10-CM | POA: Diagnosis not present

## 2015-03-28 DIAGNOSIS — Z7982 Long term (current) use of aspirin: Secondary | ICD-10-CM | POA: Diagnosis not present

## 2015-03-28 DIAGNOSIS — R5383 Other fatigue: Secondary | ICD-10-CM | POA: Insufficient documentation

## 2015-03-28 LAB — CBC
HEMATOCRIT: 38.8 % — AB (ref 39.0–52.0)
Hemoglobin: 13.3 g/dL (ref 13.0–17.0)
MCH: 31 pg (ref 26.0–34.0)
MCHC: 34.3 g/dL (ref 30.0–36.0)
MCV: 90.4 fL (ref 78.0–100.0)
Platelets: 266 10*3/uL (ref 150–400)
RBC: 4.29 MIL/uL (ref 4.22–5.81)
RDW: 12.6 % (ref 11.5–15.5)
WBC: 6.3 10*3/uL (ref 4.0–10.5)

## 2015-03-28 LAB — BASIC METABOLIC PANEL
Anion gap: 7 (ref 5–15)
BUN: 10 mg/dL (ref 6–20)
CALCIUM: 8.8 mg/dL — AB (ref 8.9–10.3)
CHLORIDE: 107 mmol/L (ref 101–111)
CO2: 23 mmol/L (ref 22–32)
CREATININE: 0.98 mg/dL (ref 0.61–1.24)
GLUCOSE: 126 mg/dL — AB (ref 65–99)
Potassium: 3.8 mmol/L (ref 3.5–5.1)
Sodium: 137 mmol/L (ref 135–145)

## 2015-03-28 MED ORDER — SODIUM CHLORIDE 0.9 % IV BOLUS (SEPSIS)
500.0000 mL | Freq: Once | INTRAVENOUS | Status: AC
  Administered 2015-03-28: 500 mL via INTRAVENOUS

## 2015-03-28 MED ORDER — SODIUM CHLORIDE 0.9 % IV SOLN: INTRAVENOUS | Status: DC

## 2015-03-28 NOTE — ED Provider Notes (Signed)
CSN: [id number]   Arrival date & time 03/28/15 1814  History  This chart was scribed for  Mancel Bale, MD by Bethel Born, ED Scribe. This patient was seen in room APA18/APA18 and the patient's care was started at 8:12 PM.  Chief Complaint  Patient presents with  . Chest Pain    HPI The history is provided by the patient and a significant other. No language interpreter was used.   Juan Wade is a 44 y.o. male with PMHx of CAD s/p CABG 2 years ago,  MI, PVC, HTN, and HLD who presents to the Emergency Department complaining of intermittent left-sided chest pain with onset last night. The pain is described as like heartburn last night and stabbing/pressure today. No relief from Protonix. He was able to eat a bowl of cereal today.  Last saw cardiology less than 5 months ago. Last echocardiogram was 4 months ago.  Also complains of fatigue, a generalized headache for 2 weeks with no relief from Flexeril or Advil, intermittent neck pain that is described as pressure, and nausea. The headache started after spending time in the heat while mowing the lawn. His significant other is worried about his chronic ankle swelling and also concerned that the symptoms may be the result of a tick bite. No fever, vomiting, or rash. Cardiology: Dr. Katrinka Blazing Past Medical History  Diagnosis Date  . Nephrolithiasis   . Coronary artery disease   . Hematoma     Subcapsule renal  . Heart attack   . PVC (premature ventricular contraction)   . HTN (hypertension)   . Diverticulosis   . Hyperlipidemia     Past Surgical History  Procedure Laterality Date  . Kidney stents    . Coronary artery bypass graft      LIMA to the LAD, SVG to ramus intermediate, SVG to right coronary artery 08/2012  . Rotator cufff Right   . Hemorrhoid surgery      x 2  . Vasectomy    . Inguinal hernia repair Bilateral   . Lithotripsy      x 3  . Cardiac surgery      Family History  Problem Relation Age of Onset  . Coronary artery  disease Father 61  . Heart disease Father   . Heart attack Maternal Grandfather   . Prostate cancer Paternal Grandfather   . Ovarian cancer Paternal Grandmother     mets  . Hypertension Mother   . Arthritis/Rheumatoid Mother   . Emphysema Mother   . COPD Mother   . Neuropathy Brother   . Liver disease Brother   . Healthy Brother     History  Substance Use Topics  . Smoking status: Current Every Day Smoker -- 0.25 packs/day for 25 years    Types: Cigarettes  . Smokeless tobacco: Never Used  . Alcohol Use: No     Review of Systems  Constitutional: Positive for fatigue. Negative for fever.  Respiratory: Negative for shortness of breath.   Cardiovascular: Positive for leg swelling. Negative for chest pain.  Gastrointestinal: Positive for nausea. Negative for vomiting.  Musculoskeletal: Positive for neck pain.  Skin: Negative for rash.  Neurological: Positive for headaches.  All other systems reviewed and are negative.    Home Medications   Prior to Admission medications   Medication Sig Start Date End Date Taking? Authorizing Provider  aspirin EC 81 MG tablet Take 81 mg by mouth daily. 10/02/12  Yes Rollene Rotunda, MD  atorvastatin (LIPITOR) 40 MG tablet  TAKE 1 TABLET BY MOUTH EVERY DAY 02/18/15  Yes Lyn Records, MD  fluticasone [facility]) 50 MCG/ACT nasal spray Place 2 sprays into both nostrils every evening.   Yes Historical Provider, MD  loratadine (CLARITIN) 10 MG tablet Take 10 mg by mouth at bedtime.    Yes Historical Provider, MD  pantoprazole (PROTONIX) 40 MG tablet TAKE 1 TABLET BY MOUTH DAILY 08/24/14  Yes Louis Meckel, MD  tamsulosin (FLOMAX) 0.4 MG CAPS capsule Take 0.4 mg by mouth daily.   Yes Historical Provider, MD    Allergies  Codeine; Demerol; Dilaudid; and Morphine and related  Triage Vitals: BP 114/77 mmHg  Pulse 77  Temp(Src) 98.6 F (37 C)  Resp 21  Ht 5\' 8"  (1.727 m)  Wt 195 lb (88.451 kg)  BMI 29.66 kg/m2  SpO2 96%  Physical Exam   Constitutional: He is oriented to person, place, and time. He appears well-developed and well-nourished.  HENT:  Head: Normocephalic and atraumatic.  Right Ear: External ear normal.  Left Ear: External ear normal.  Eyes: Conjunctivae and EOM are normal. Pupils are equal, round, and reactive to light.  Neck: Normal range of motion and phonation normal. Neck supple.  Cardiovascular: Normal rate, regular rhythm and normal heart sounds.   Pulmonary/Chest: Effort normal and breath sounds normal. He exhibits no bony tenderness.  Abdominal: Soft. There is no tenderness.  Musculoskeletal: Normal range of motion.  Neurological: He is alert and oriented to person, place, and time. No cranial nerve deficit or sensory deficit. He exhibits normal muscle tone. Coordination normal.  No dysarthria, aphasia, or nystagmus No ataxia  Skin: Skin is warm, dry and intact.  Psychiatric: He has a normal mood and affect. His behavior is normal. Judgment and thought content normal.  Nursing note and vitals reviewed.   ED Course  Procedures   DIAGNOSTIC STUDIES: Oxygen Saturation is 96% on RA, normal by my interpretation.    COORDINATION OF CARE: 8:18 PM Discussed treatment plan which includes CT head without contrast, CXR, EKG, ;ab work and IVF with pt at bedside and pt agreed to plan.  Medications  0.9 %  sodium chloride infusion ( Intravenous Not Given 03/28/15 2144)  sodium chloride 0.9 % bolus 500 mL (0 mLs Intravenous Stopped 03/28/15 2129)    Labs Review-  Labs Reviewed  BASIC METABOLIC PANEL - Abnormal; Notable for the following:    Glucose, Bld 126 (*)    Calcium 8.8 (*)    All other components within normal limits  CBC - Abnormal; Notable for the following:    HCT 38.8 (*)    All other components within normal limits   Patient Vitals for the past 24 hrs:  BP Temp Temp src Pulse Resp SpO2 Height Weight  03/28/15 2143 - 98.1 F (36.7 C) Oral - - - - -  03/28/15 2130 113/78 mmHg - - 73 15  97 % - -  03/28/15 2115 - - - 76 21 95 % - -  03/28/15 2100 111/83 mmHg - - 76 20 97 % - -  03/28/15 2045 - - - 75 20 96 % - -  03/28/15 2030 103/78 mmHg - - 73 21 93 % - -  03/28/15 2015 - - - 78 15 95 % - -  03/28/15 2000 107/78 mmHg - - 77 21 97 % - -  03/28/15 1945 - - - 77 21 96 % - -  03/28/15 1930 114/77 mmHg - - 75 20 93 % - -  03/28/15 1915 - - - 89 26 97 % - -  03/28/15 1900 - - - 83 20 94 % - -  03/28/15 1845 - - - 82 19 94 % - -  03/28/15 1824 116/80 mmHg 98.6 F (37 C) - 90 17 95 %  (1.727 m) 195 lb (88.451 kg)    Imaging Review Dg Chest 2 View  03/28/2015   CLINICAL DATA:  Chest pain and nausea since last evening.  EXAM: CHEST  2 VIEW  COMPARISON:  12/06/2012.  FINDINGS: The cardiac silhouette, mediastinal and hilar contours are within normal limits and stable. Stable surgical changes from bypass surgery. The lungs are clear. No pleural effusion. The bony thorax is intact.  IMPRESSION: No acute cardiopulmonary findings.   Electronically Signed   By: Rudie Meyer M.D.   On: 03/28/2015 18:47   Ct Head Wo Contrast  03/28/2015   CLINICAL DATA:  Initial encounter for 2 week history of headache.  EXAM: CT HEAD WITHOUT CONTRAST  TECHNIQUE: Contiguous axial images were obtained from the base of the skull through the vertex without intravenous contrast.  COMPARISON:  None.  FINDINGS: There is no evidence for acute hemorrhage, hydrocephalus, mass lesion, or abnormal extra-axial fluid collection. No definite CT evidence for acute infarction. The visualized paranasal sinuses and mastoid air cells are clear.  IMPRESSION: Normal study.   Electronically Signed   By: Kennith Center M.D.   On: 03/28/2015 20:46    EKG Interpretation  Date/Time:  Monday March 28 2015 18:21:20 EDT Ventricular Rate:  84 PR Interval:  140 QRS Duration: 91 QT Interval:  358 QTC Calculation: 423 R Axis:   74 Text Interpretation:  Sinus rhythm Low voltage, precordial leads Anteroseptal infarct, old since  last tracing no significant change Confirmed by [facility]  MD, Evonda Enge [phone]) on 03/28/2015 7:25:02 PM       MDM   Final diagnoses:  Headache, unspecified headache type  Nonspecific chest pain   Nonspecific HA. No concern for infectious process. Doubt meningitis, CVA, sinusitis or metabolic instability.  Nursing Notes Reviewed/ Care Coordinated Applicable Imaging Reviewed Interpretation of Laboratory Data incorporated into ED treatment  The patient appears reasonably screened and/or stabilized for discharge and I doubt any other medical condition or other [facility] requiring further screening, evaluation, or treatment in the ED at this time prior to discharge.  Plan: Home Medications- usual; Home Treatments- rest; return here if the recommended treatment, does not improve the symptoms; Recommended follow up- PCP prn   I personally performed the services described in this documentation, which was scribed in my presence. The recorded information has been reviewed and is accurate.     Mancel Bale, MD 03/29/15 [phone]

## 2015-03-28 NOTE — ED Notes (Signed)
Pt c/o left sided cp since last night with come nausea. Pt also reports ha x 2 weeks.

## 2015-03-28 NOTE — ED Notes (Signed)
Computer down and pt was unable to sign out at discharge.

## 2015-03-28 NOTE — Discharge Instructions (Signed)
Take Tylenol for headache. Drink plenty of fluids. Try to stop smoking.    Chest Pain (Nonspecific) It is often hard to give a specific diagnosis for the cause of chest pain. There is always a chance that your pain could be related to something serious, such as a heart attack or a blood clot in the lungs. You need to follow up with your health care provider for further evaluation. CAUSES   Heartburn.  Pneumonia or bronchitis.  Anxiety or stress.  Inflammation around your heart (pericarditis) or lung (pleuritis or pleurisy).  A blood clot in the lung.  A collapsed lung (pneumothorax). It can develop suddenly on its own (spontaneous pneumothorax) or from trauma to the chest.  Shingles infection (herpes zoster virus). The chest wall is composed of bones, muscles, and cartilage. Any of these can be the source of the pain.  The bones can be bruised by injury.  The muscles or cartilage can be strained by coughing or overwork.  The cartilage can be affected by inflammation and become sore (costochondritis). DIAGNOSIS  Lab tests or other studies may be needed to find the cause of your pain. Your health care provider may have you take a test called an ambulatory electrocardiogram (ECG). An ECG records your heartbeat patterns over a 24-hour period. You may also have other tests, such as:  Transthoracic echocardiogram (TTE). During echocardiography, sound waves are used to evaluate how blood flows through your heart.  Transesophageal echocardiogram (TEE).  Cardiac monitoring. This allows your health care provider to monitor your heart rate and rhythm in real time.  Holter monitor. This is a portable device that records your heartbeat and can help diagnose heart arrhythmias. It allows your health care provider to track your heart activity for several days, if needed.  Stress tests by exercise or by giving medicine that makes the heart beat faster. TREATMENT   Treatment depends on what  may be causing your chest pain. Treatment may include:  Acid blockers for heartburn.  Anti-inflammatory medicine.  Pain medicine for inflammatory conditions.  Antibiotics if an infection is present.  You may be advised to change lifestyle habits. This includes stopping smoking and avoiding alcohol, caffeine, and chocolate.  You may be advised to keep your head raised (elevated) when sleeping. This reduces the chance of acid going backward from your stomach into your esophagus. Most of the time, nonspecific chest pain will improve within 2-3 days with rest and mild pain medicine.  HOME CARE INSTRUCTIONS   If antibiotics were prescribed, take them as directed. Finish them even if you start to feel better.  For the next few days, avoid physical activities that bring on chest pain. Continue physical activities as directed.  Do not use any tobacco products, including cigarettes, chewing tobacco, or electronic cigarettes.  Avoid drinking alcohol.  Only take medicine as directed by your health care provider.  Follow your health care provider's suggestions for further testing if your chest pain does not go away.  Keep any follow-up appointments you made. If you do not go to an appointment, you could develop lasting (chronic) problems with pain. If there is any problem keeping an appointment, call to reschedule. SEEK MEDICAL CARE IF:   Your chest pain does not go away, even after treatment.  You have a rash with blisters on your chest.  You have a fever. SEEK IMMEDIATE MEDICAL CARE IF:   You have increased chest pain or pain that spreads to your arm, neck, jaw, back, or abdomen.  You have shortness of breath.  You have an increasing cough, or you cough up blood.  You have severe back or abdominal pain.  You feel nauseous or vomit.  You have severe weakness.  You faint.  You have chills. This is an emergency. Do not wait to see if the pain will go away. Get medical help at  once. Call your local emergency services (911 in [place] Do not drive yourself to the hospital. MAKE SURE YOU:   Understand these instructions.  Will watch your condition.  Will get help right away if you are not doing well or get worse. Document Released: 05/23/2005 Document Revised: 08/18/2013 Document Reviewed: 03/18/2008 [facility] Patient Information 2015 [place], [place]. This information is not intended to replace advice given to you by your health care provider. Make sure you discuss any questions you have with your health care provider.  General Headache Without Cause A headache is pain or discomfort felt around the head or neck area. The specific cause of a headache may not be found. There are many causes and types of headaches. A few common ones are:  Tension headaches.  Migraine headaches.  Cluster headaches.  Chronic daily headaches. HOME CARE INSTRUCTIONS   Keep all follow-up appointments with your caregiver or any specialist referral.  Only take over-the-counter or prescription medicines for pain or discomfort as directed by your caregiver.  Lie down in a dark, quiet room when you have a headache.  Keep a headache journal to find out what may trigger your migraine headaches. For example, write down:  What you eat and drink.  How much sleep you get.  Any change to your diet or medicines.  Try massage or other relaxation techniques.  Put ice packs or heat on the head and neck. Use these 3 to 4 times per day for 15 to 20 minutes each time, or as needed.  Limit stress.  Sit up straight, and do not tense your muscles.  Quit smoking if you smoke.  Limit alcohol use.  Decrease the amount of caffeine you drink, or stop drinking caffeine.  Eat and sleep on a regular schedule.  Get 7 to 9 hours of sleep, or as recommended by your caregiver.  Keep lights dim if bright lights bother you and make your headaches worse. SEEK MEDICAL CARE IF:   You have problems  with the medicines you were prescribed.  Your medicines are not working.  You have a change from the usual headache.  You have nausea or vomiting. SEEK IMMEDIATE MEDICAL CARE IF:   Your headache becomes severe.  You have a fever.  You have a stiff neck.  You have loss of vision.  You have muscular weakness or loss of muscle control.  You start losing your balance or have trouble walking.  You feel faint or pass out.  You have severe symptoms that are different from your first symptoms. MAKE SURE YOU:   Understand these instructions.  Will watch your condition.  Will get help right away if you are not doing well or get worse. Document Released: 08/13/2005 Document Revised: 11/05/2011 Document Reviewed: 08/29/2011 [facility] Patient Information 2015 [place], [place]. This information is not intended to replace advice given to you by your health care provider. Make sure you discuss any questions you have with your health care provider.

## 2015-04-06 ENCOUNTER — Ambulatory Visit (HOSPITAL_BASED_OUTPATIENT_CLINIC_OR_DEPARTMENT_OTHER): Payer: Medicaid Other

## 2015-04-11 ENCOUNTER — Ambulatory Visit (HOSPITAL_BASED_OUTPATIENT_CLINIC_OR_DEPARTMENT_OTHER): Payer: Medicaid Other | Attending: Cardiology

## 2015-04-11 DIAGNOSIS — G4761 Periodic limb movement disorder: Secondary | ICD-10-CM | POA: Diagnosis not present

## 2015-04-11 DIAGNOSIS — R0683 Snoring: Secondary | ICD-10-CM

## 2015-04-11 DIAGNOSIS — G478 Other sleep disorders: Secondary | ICD-10-CM | POA: Insufficient documentation

## 2015-04-11 DIAGNOSIS — R5383 Other fatigue: Secondary | ICD-10-CM | POA: Diagnosis present

## 2015-04-19 ENCOUNTER — Other Ambulatory Visit: Payer: Self-pay | Admitting: Gastroenterology

## 2015-04-30 ENCOUNTER — Telehealth: Payer: Self-pay | Admitting: Cardiology

## 2015-04-30 NOTE — Sleep Study (Signed)
NAME: Juan Wade DATE OF BIRTH:  1971/08/15 MEDICAL RECORD NUMBER [id number]  LOCATION: [facility]  PHYSICIAN: KELLY,THOMAS A  DATE OF STUDY: 04/11/2015  SLEEP STUDY TYPE: Nocturnal Polysomnogram               REFERRING PHYSICIAN: Quintella Reichert, MD Patient Name: Juan Wade, Juan Wade Date: 04/11/2015 Gender: Male D.O.B: August 12, 1971 Age (years): 64 Referring Provider: Armanda Magic MD, ABSM Height (inches): 68 Interpreting Physician: Nicki Guadalajara MD, ABSM Weight (lbs): 195 RPSGT: Melburn Popper BMI: 30 MRN: [id number] Neck Size: 17.00 CLINICAL INFORMATION Sleep Study Type: NPSG  Indication for sleep study: Fatigue, Snoring  Epworth Sleepiness Score: 9  SLEEP STUDY TECHNIQUE As per the AASM Manual for the Scoring of Sleep and Associated Events v2.3 (April 2016) with a hypopnea requiring 4% desaturations.  The channels recorded and monitored were frontal, central and occipital EEG, electrooculogram (EOG), submentalis EMG (chin), nasal and oral airflow, thoracic and abdominal wall motion, anterior tibialis EMG, snore microphone, electrocardiogram, and pulse oximetry.  MEDICATIONS  aspirin EC 81 MG tablet 81 mg, Daily     atorvastatin (LIPITOR) 40 MG tablet      fluticasone (FLONASE) 50 MCG/ACT nasal spray 2 spray, Every evening     loratadine (CLARITIN) 10 MG tablet 10 mg, Daily at bedtime     pantoprazole (PROTONIX) 40 MG tablet      pantoprazole (PROTONIX) 40 MG tablet      tamsulosin (FLOMAX) 0.4 MG CAPS    Medications self-administered by patient during sleep study : No sleep medicine administered.  SLEEP ARCHITECTURE The study was initiated at 10:39:48 PM and ended at 4:46:18 AM.  Sleep onset time was 113.9 minutes and the sleep efficiency was 58.4%. The total sleep time was 214.0 minutes.  Stage REM latency was 53.0 minutes.  Wake after sleep onset (WASO): 38.6 minutes  The patient spent 7.94% of the night in stage N1 sleep, 63.08% in  stage N2 sleep, 12.85% in stage N3 and 16.12% in REM.  Alpha intrusion was absent.  Supine sleep was 0.00%.  RESPIRATORY PARAMETERS The overall apnea/hypopnea index (AHI) was 1.7 per hour. There were 1 total apneas, including 1 obstructive, 0 central and 0 mixed apneas. There were 5 hypopneas and 8 RERAs.  The AHI during Stage REM sleep was 3.5 per hour.  AHI while supine was N/A per hour.  The mean oxygen saturation was 91.48%. The minimum SpO2 during sleep was 86% during REM sleep.  Moderate snoring was noted during this study.  CARDIAC DATA The 2 lead EKG demonstrated sinus rhythm. The mean heart rate was not able to be calculated. Other EKG findings include: None.  LEG MOVEMENT DATA The total PLMS were 118 with a resulting PLMS index of 33.08. Associated arousal with leg movement index was 6.2 .  IMPRESSIONS No significant obstructive sleep apnea occurred during this study (AHI = 1.7/h). No significant central sleep apnea occurred during this study (CAI = 0.0/h). Reduced sleep efficiency at 58.4% with difficulty with sleep initiation (sleep latency 113.9 minutes)  Moderate snoring. No cardiac abnormalities were noted during this study. Moderate periodic limb movements of sleep with an index of 33.1. Associated arousals were significant with an index of 27.2.  DIAGNOSIS Periodic Limb Movement Disorder of Sleep Increased Upper Airway Resistance (UARS)  RECOMMENDATIONS At present there is no indication for CPAP therapy. Efforts should be made to optimize nasal and oropharyngeal patency. Avoid alcohol, sedatives and other CNS depressants that may worsen sleep apnea  and disrupt normal sleep architecture. Sleep hygiene should be reviewed to assess factors that may improve sleep quality. Weight management and regular exercise should be initiated or continued if appropriate. If patient is symptomatic with restless legs, consider a trial of medical therapy with a PLMS index of  33.1   KELLY,THOMAS A Diplomate, Biomedical engineer of Sleep Medicine  ELECTRONICALLY SIGNED ON:  04/30/2015, 1:12 PM [facility] PH: [phone]   FX: [phone] ACCREDITED BY THE AMERICAN ACADEMY OF SLEEP MEDICINE

## 2015-04-30 NOTE — Telephone Encounter (Signed)
Please let patient know that patient did not have any significant OSA>  He did have increased leg movements. Please find out if he has any problems with restless legs at night

## 2015-05-03 NOTE — Telephone Encounter (Signed)
Left message to call back  

## 2015-05-05 NOTE — Telephone Encounter (Signed)
Patient is aware of results. Stated verbal understanding. Restless legs has not been a problem for him, but he stated he just felt like he didn't sleep well that night.

## 2015-07-20 ENCOUNTER — Observation Stay (HOSPITAL_COMMUNITY)
Admission: EM | Admit: 2015-07-20 | Discharge: 2015-07-22 | Disposition: A | Discharge status: Home / Self Care | Payer: Medicaid Other | Attending: Family Medicine | Admitting: Family Medicine

## 2015-07-20 ENCOUNTER — Encounter (HOSPITAL_COMMUNITY): Payer: Self-pay | Admitting: Emergency Medicine

## 2015-07-20 ENCOUNTER — Emergency Department (HOSPITAL_COMMUNITY): Payer: Medicaid Other

## 2015-07-20 DIAGNOSIS — I2510 Atherosclerotic heart disease of native coronary artery without angina pectoris: Secondary | ICD-10-CM | POA: Insufficient documentation

## 2015-07-20 DIAGNOSIS — N400 Enlarged prostate without lower urinary tract symptoms: Secondary | ICD-10-CM | POA: Diagnosis not present

## 2015-07-20 DIAGNOSIS — I10 Essential (primary) hypertension: Secondary | ICD-10-CM | POA: Insufficient documentation

## 2015-07-20 DIAGNOSIS — F17210 Nicotine dependence, cigarettes, uncomplicated: Secondary | ICD-10-CM | POA: Insufficient documentation

## 2015-07-20 DIAGNOSIS — E785 Hyperlipidemia, unspecified: Secondary | ICD-10-CM | POA: Diagnosis not present

## 2015-07-20 DIAGNOSIS — Z720 Tobacco use: Secondary | ICD-10-CM

## 2015-07-20 DIAGNOSIS — K210 Gastro-esophageal reflux disease with esophagitis: Secondary | ICD-10-CM

## 2015-07-20 DIAGNOSIS — Z7951 Long term (current) use of inhaled steroids: Secondary | ICD-10-CM | POA: Insufficient documentation

## 2015-07-20 DIAGNOSIS — K219 Gastro-esophageal reflux disease without esophagitis: Secondary | ICD-10-CM | POA: Diagnosis present

## 2015-07-20 DIAGNOSIS — I493 Ventricular premature depolarization: Secondary | ICD-10-CM | POA: Insufficient documentation

## 2015-07-20 DIAGNOSIS — Z951 Presence of aortocoronary bypass graft: Secondary | ICD-10-CM | POA: Insufficient documentation

## 2015-07-20 DIAGNOSIS — Z8673 Personal history of transient ischemic attack (TIA), and cerebral infarction without residual deficits: Secondary | ICD-10-CM | POA: Insufficient documentation

## 2015-07-20 DIAGNOSIS — K5790 Diverticulosis of intestine, part unspecified, without perforation or abscess without bleeding: Secondary | ICD-10-CM | POA: Diagnosis not present

## 2015-07-20 DIAGNOSIS — N200 Calculus of kidney: Secondary | ICD-10-CM | POA: Diagnosis not present

## 2015-07-20 DIAGNOSIS — Z7982 Long term (current) use of aspirin: Secondary | ICD-10-CM | POA: Insufficient documentation

## 2015-07-20 DIAGNOSIS — I25810 Atherosclerosis of coronary artery bypass graft(s) without angina pectoris: Secondary | ICD-10-CM | POA: Diagnosis present

## 2015-07-20 DIAGNOSIS — R079 Chest pain, unspecified: Principal | ICD-10-CM

## 2015-07-20 LAB — BASIC METABOLIC PANEL
Anion gap: 6 (ref 5–15)
BUN: 11 mg/dL (ref 6–20)
CO2: 26 mmol/L (ref 22–32)
CREATININE: 1.02 mg/dL (ref 0.61–1.24)
Calcium: 8.8 mg/dL — ABNORMAL LOW (ref 8.9–10.3)
Chloride: 108 mmol/L (ref 101–111)
GFR calc non Af Amer: >60 mL/min (ref >60)
Glucose, Bld: 99 mg/dL (ref 65–99)
POTASSIUM: 3.8 mmol/L (ref 3.5–5.1)
SODIUM: 140 mmol/L (ref 135–145)

## 2015-07-20 LAB — CBC WITH DIFFERENTIAL/PLATELET
BASOS ABS: 0 10*3/uL (ref 0.0–0.1)
BASOS PCT: 0 %
Eosinophils Absolute: 0.1 10*3/uL (ref 0.0–0.7)
Eosinophils Relative: 1 %
HCT: 41.1 % (ref 39.0–52.0)
HEMOGLOBIN: 13.8 g/dL (ref 13.0–17.0)
Lymphocytes Relative: 40 %
Lymphs Abs: 3.1 10*3/uL (ref 0.7–4.0)
MCH: 30.5 pg (ref 26.0–34.0)
MCHC: 33.6 g/dL (ref 30.0–36.0)
MCV: 90.9 fL (ref 78.0–100.0)
Monocytes Absolute: 0.5 10*3/uL (ref 0.1–1.0)
Monocytes Relative: 7 %
NEUTROS ABS: 4.1 10*3/uL (ref 1.7–7.7)
NEUTROS PCT: 52 %
Platelets: 229 10*3/uL (ref 150–400)
RBC: 4.52 MIL/uL (ref 4.22–5.81)
RDW: 12.8 % (ref 11.5–15.5)
WBC: 7.9 10*3/uL (ref 4.0–10.5)

## 2015-07-20 LAB — TROPONIN I
Troponin I: <0.03 ng/mL (ref <0.031)
Troponin I: <0.03 ng/mL (ref <0.031)

## 2015-07-20 MED ORDER — METHOCARBAMOL 1000 MG/10ML IJ SOLN
500.0000 mg | Freq: Three times a day (TID) | INTRAVENOUS | Status: DC | PRN
  Filled 2015-07-20: qty 5

## 2015-07-20 MED ORDER — ACETAMINOPHEN 650 MG RE SUPP: 650.0000 mg | Freq: Four times a day (QID) | RECTAL | Status: DC | PRN

## 2015-07-20 MED ORDER — ASPIRIN EC 81 MG PO TBEC
81.0000 mg | DELAYED_RELEASE_TABLET | Freq: Every day | ORAL | Status: DC
  Administered 2015-07-20 – 2015-07-22 (×3): 81 mg via ORAL
  Filled 2015-07-20 (×3): qty 1

## 2015-07-20 MED ORDER — NITROGLYCERIN 2 % TD OINT
1.0000 [in_us] | TOPICAL_OINTMENT | Freq: Once | TRANSDERMAL | Status: AC
  Administered 2015-07-20: 1 [in_us] via TOPICAL
  Filled 2015-07-20: qty 1

## 2015-07-20 MED ORDER — ONDANSETRON HCL 4 MG PO TABS
4.0000 mg | ORAL_TABLET | Freq: Four times a day (QID) | ORAL | Status: DC | PRN
  Administered 2015-07-22: 4 mg via ORAL
  Filled 2015-07-20 (×2): qty 1

## 2015-07-20 MED ORDER — SODIUM CHLORIDE 0.9 % IJ SOLN
3.0000 mL | Freq: Two times a day (BID) | INTRAMUSCULAR | Status: DC
  Administered 2015-07-20 – 2015-07-22 (×3): 3 mL via INTRAVENOUS

## 2015-07-20 MED ORDER — DICLOFENAC SODIUM 1 % TD GEL
4.0000 g | Freq: Four times a day (QID) | TRANSDERMAL | Status: DC | PRN
  Filled 2015-07-20: qty 100

## 2015-07-20 MED ORDER — ENOXAPARIN SODIUM 40 MG/0.4ML ~~LOC~~ SOLN: 40.0000 mg | SUBCUTANEOUS | Status: DC

## 2015-07-20 MED ORDER — PANTOPRAZOLE SODIUM 40 MG PO TBEC
40.0000 mg | DELAYED_RELEASE_TABLET | Freq: Every day | ORAL | Status: DC
  Administered 2015-07-20 – 2015-07-22 (×3): 40 mg via ORAL
  Filled 2015-07-20 (×3): qty 1

## 2015-07-20 MED ORDER — SODIUM CHLORIDE 0.9 % IV SOLN: 250.0000 mL | INTRAVENOUS | Status: DC | PRN

## 2015-07-20 MED ORDER — TAMSULOSIN HCL 0.4 MG PO CAPS
0.4000 mg | ORAL_CAPSULE | Freq: Every day | ORAL | Status: DC
  Administered 2015-07-20 – 2015-07-22 (×3): 0.4 mg via ORAL
  Filled 2015-07-20 (×3): qty 1

## 2015-07-20 MED ORDER — NICOTINE 14 MG/24HR TD PT24
14.0000 mg | MEDICATED_PATCH | Freq: Every day | TRANSDERMAL | Status: DC
  Administered 2015-07-20 – 2015-07-22 (×3): 14 mg via TRANSDERMAL
  Filled 2015-07-20 (×3): qty 1

## 2015-07-20 MED ORDER — ACETAMINOPHEN 325 MG PO TABS
650.0000 mg | ORAL_TABLET | Freq: Four times a day (QID) | ORAL | Status: DC | PRN
  Administered 2015-07-21: 650 mg via ORAL
  Filled 2015-07-20: qty 2

## 2015-07-20 MED ORDER — SODIUM CHLORIDE 0.9 % IJ SOLN
3.0000 mL | INTRAMUSCULAR | Status: DC | PRN
  Administered 2015-07-22: 3 mL via INTRAVENOUS
  Filled 2015-07-20: qty 3

## 2015-07-20 MED ORDER — DIAZEPAM 5 MG/ML IJ SOLN
2.5000 mg | Freq: Once | INTRAMUSCULAR | Status: AC
  Administered 2015-07-20: 2.5 mg via INTRAVENOUS
  Filled 2015-07-20: qty 2

## 2015-07-20 MED ORDER — ONDANSETRON HCL 4 MG/2ML IJ SOLN
4.0000 mg | Freq: Four times a day (QID) | INTRAMUSCULAR | Status: DC | PRN
Start: 2015-07-20 — End: 2015-07-22

## 2015-07-20 MED ORDER — GI COCKTAIL ~~LOC~~
30.0000 mL | Freq: Four times a day (QID) | ORAL | Status: DC | PRN
  Administered 2015-07-21: 30 mL via ORAL
  Filled 2015-07-20: qty 30

## 2015-07-20 MED ORDER — SODIUM CHLORIDE 0.9 % IJ SOLN
3.0000 mL | Freq: Two times a day (BID) | INTRAMUSCULAR | Status: DC
  Administered 2015-07-20: 3 mL via INTRAVENOUS

## 2015-07-20 MED ORDER — ATORVASTATIN CALCIUM 40 MG PO TABS
40.0000 mg | ORAL_TABLET | Freq: Every day | ORAL | Status: DC
  Administered 2015-07-21 – 2015-07-22 (×2): 40 mg via ORAL
  Filled 2015-07-20 (×2): qty 1

## 2015-07-20 NOTE — ED Notes (Signed)
Pt with pain with deep breath, EDP made aware

## 2015-07-20 NOTE — ED Notes (Signed)
Patient with c/o right sided chest pain, sharp in nature that started around noon today. Patient reports intermittent pain with no apparent factors that alleviate or aggravate the pain. Deep breaths can sometimes worsen pain. Patient with h/o CABG in 2013. 1 81 mg ASA given to patient by wife PTA. Patient alert/oriented.

## 2015-07-20 NOTE — H&P (Signed)
[facility] History and Physical  Juan Wade XBM:[id number] DOB: 1971/02/26 DOA: 07/20/2015  Referring physician: Dr Judd Lien - [facility] PCP: Juliette Alcide, MD   Chief Complaint: CP  HPI: Juan Wade is a 44 y.o. male  Chest pain. Sharp. Right lower anterior chest wall. Started at 1200 acutely. Occurred when patient was getting into his car. Denies any shortness of breath but states that when the pain comes on strong it "takes my breath away. " Chest pain episodes last several minutes at a time and then resolve spontaneously. Multiple episodes since onset.  Non-exertional Last several minutes.  Different than last heart attack Worse w/ deep breathing.  Still smokes 1/2ppd.    Review of Systems:  Constitutional:  No weight loss, night sweats, Fevers, chills, fatigue.  HEENT:  No headaches, Difficulty swallowing,Tooth/dental problems,Sore throat, Cardio-vascular: Per HPI GI:  No heartburn, indigestion, abdominal pain, nausea, vomiting, diarrhea, change in bowel habits, loss of appetite  Resp:  No excess mucus, no productive cough, No non-productive cough, No coughing up of blood.No change in color of mucus.No wheezing.No chest wall deformity  Skin:  no rash or lesions.  GU:  no dysuria, change in color of urine, no urgency or frequency. No flank pain.  Musculoskeletal:   No joint pain or swelling. No decreased range of motion. No back pain.  Psych:  No change in mood or affect. No depression or anxiety. No memory loss.  Neuro:  No change in sensation, unilateral strength, or cognitive abilities  All other systems were reviewed and are negative.  Past Medical History  Diagnosis Date  . Nephrolithiasis   . Coronary artery disease   . Hematoma     Subcapsule renal  . Heart attack (HCC)   . PVC (premature ventricular contraction)   . HTN (hypertension)   . Diverticulosis   . Hyperlipidemia    Past Surgical History  Procedure Laterality Date  . Kidney stents     . Coronary artery bypass graft      LIMA to the LAD, SVG to ramus intermediate, SVG to right coronary artery 08/2012  . Rotator cufff Right   . Hemorrhoid surgery      x 2  . Vasectomy    . Inguinal hernia repair Bilateral   . Lithotripsy      x 3  . Cardiac surgery     Social History:  reports that he has been smoking Cigarettes.  He has a 6.25 pack-year smoking history. He has never used smokeless tobacco. He reports that he does not drink alcohol or use illicit drugs.  Allergies  Allergen Reactions  . Codeine Nausea And Vomiting  . Demerol [Meperidine] Other (See Comments)    Becomes angry   . Dilaudid [Hydromorphone Hcl] Nausea And Vomiting    IV  . Morphine And Related Nausea And Vomiting    Family History  Problem Relation Age of Onset  . Coronary artery disease Father 52  . Heart disease Father   . Heart attack Maternal Grandfather   . Prostate cancer Paternal Grandfather   . Ovarian cancer Paternal Grandmother     mets  . Hypertension Mother   . Arthritis/Rheumatoid Mother   . Emphysema Mother   . COPD Mother   . Neuropathy Brother   . Liver disease Brother   . Healthy Brother      Prior to Admission medications   Medication Sig Start Date End Date Taking? Authorizing Provider  aspirin EC 81 MG tablet Take 81 mg  by mouth daily. 10/02/12  Yes Rollene RotundaJames Hochrein, MD  atorvastatin (LIPITOR) 40 MG tablet TAKE 1 TABLET BY MOUTH EVERY DAY 02/18/15  Yes Lyn RecordsHenry W Smith, MD  fluticasone [facility](FLONASE) 50 MCG/ACT nasal spray Place 2 sprays into both nostrils every evening.   Yes Historical Provider, MD  loratadine (CLARITIN) 10 MG tablet Take 10 mg by mouth at bedtime.    Yes Historical Provider, MD  pantoprazole (PROTONIX) 40 MG tablet TAKE 1 TABLET BY MOUTH DAILY 08/24/14  Yes Louis Meckelobert D Kaplan, MD  tamsulosin (FLOMAX) 0.4 MG CAPS capsule Take 0.4 mg by mouth daily.   Yes Historical Provider, MD   Physical Exam: Filed Vitals:   07/20/15 1800 07/20/15 1815 07/20/15 1830 07/20/15  1845  BP: 123/81  118/86   Pulse: 72 72 70 69  Temp:      TempSrc:      Resp: 13 17 13 19   Height:      Weight:      SpO2: 99% 99% 97% 98%    Wt Readings from Last 3 Encounters:  07/20/15 90.719 kg (200 lb)  04/12/15 88.451 kg (195 lb)  03/28/15 88.451 kg (195 lb)    General:  Appears calm and comfortable Eyes:  PERRL, EOMI, normal lids, iris ENT:  grossly normal hearing, lips & tongue Neck:  no LAD, masses or thyromegaly Cardiovascular:  RRR, no m/r/g. No LE edema.  Respiratory:  CTA bilaterally, no w/r/r. Normal respiratory effort. Abdomen:  soft, ntnd Skin:  no rash or induration seen on limited exam Musculoskeletal: reproducible R chest pain w/ palpation Psychiatric:  grossly normal mood and affect, speech fluent and appropriate Neurologic:  CN 2-12 grossly intact, moves all extremities in coordinated fashion.          Labs on Admission:  Basic Metabolic Panel:  Recent Labs Lab 07/20/15 1518  NA 140  K 3.8  CL 108  CO2 26  GLUCOSE 99  BUN 11  CREATININE 1.02  CALCIUM 8.8*   Liver Function Tests: No results for input(s): AST, ALT, ALKPHOS, BILITOT, PROT, ALBUMIN in the last 168 hours. No results for input(s): LIPASE, AMYLASE in the last 168 hours. No results for input(s): AMMONIA in the last 168 hours. CBC:  Recent Labs Lab 07/20/15 1518  WBC 7.9  NEUTROABS 4.1  HGB 13.8  HCT 41.1  MCV 90.9  PLT 229   Cardiac Enzymes:  Recent Labs Lab 07/20/15 1518  TROPONINI <0.03    BNP (last 3 results) No results for input(s): BNP in the last 8760 hours.  ProBNP (last 3 results) No results for input(s): PROBNP in the last 8760 hours.   CREATININE: 1.02 (07/20/15 1518) Estimated creatinine clearance - 98.9 mL/min  CBG: No results for input(s): GLUCAP in the last 168 hours.  Radiological Exams on Admission: Dg Chest Portable 1 View  07/20/2015  CLINICAL DATA:  Chest pain for 3 hours. History of myocardial infarction and CABG. Initial encounter.  EXAM: PORTABLE CHEST 1 VIEW COMPARISON:  03/28/2015 and 12/06/2012. FINDINGS: 1523 hours. There are lower lung volumes with mild resulting atelectasis at both lung bases. No edema, confluent airspace opacity, pleural effusion or pneumothorax demonstrated. The heart size and mediastinal contours are stable status post median sternotomy and CABG. The bones appear unchanged. IMPRESSION: Mild bibasilar atelectasis.  Otherwise stable chest. Electronically Signed   By: Carey BullocksWilliam  Veazey M.D.   On: 07/20/2015 15:38   Assessment/Plan Principal Problem:   Chest pain at rest Active Problems:   CAD (coronary artery disease) of  artery bypass graft   Hyperlipemia   Esophageal reflux   BPH (benign prostatic hyperplasia)   Tobacco use    CP: h/o MI s/p 3 vessel CABG 4 yrs ago. HEART score 3-4. Likely MSK pain but cannot exclude cardiac. EKG w/o evidence of ACS. Reproducible on palpation or deep breathing. Trop neg - Tele - Observation - cycle trop - EKG in am - Robaxin, and valium (x1) - Gi cocktail - Voltaren Gel - Nitro PRN - cont ASA  HLD - continue Statin  BPH: - continue flomax  GERD: - cont PPI  Tobacco: 1/2ppd NIcotine patch   Code Status: FULL  DVT Prophylaxis: Lovenox Family Communication: none Disposition Plan: Pending Improvement    Juan Abts Shela Commons, MD Family Medicine [facility] [email or web address] Password [email or web address]

## 2015-07-20 NOTE — ED Provider Notes (Signed)
CSN: [id number]     Arrival date & time 07/20/15  1458 History   First MD Initiated Contact with Patient 07/20/15 1524     Chief Complaint  Patient presents with  . Chest Pain     (Consider location/radiation/quality/duration/timing/severity/associated sxs/prior Treatment) HPI Comments: Patient is a 44 year old male with history of coronary artery disease status post CABG 3, 4 years ago. He presents today with complaints of sharp pains to the right lower anterior chest wall that started today at approximately noon. He states he was getting into his car when the symptoms began and found it difficult to drive. He denies feeling short of breath, but when the pain hits he reports it "taking his breath away". The pain lasts for several minutes then resolves. He has had multiple episodes of this since the onset at noon. He denies any recent exertional symptoms. He denies any problems since his bypass surgery.  His cardiologist is Marketing executiveHank Smith in [place].  Patient is a 44 y.o. male presenting with chest pain. The history is provided by the patient.  Chest Pain Pain location:  R chest and substernal area Pain quality: sharp   Pain radiates to:  Does not radiate Pain radiates to the back: no   Pain severity:  Moderate Onset quality:  Sudden Duration:  3 hours Timing:  Intermittent Progression:  Worsening Chronicity:  New Relieved by:  Nothing Worsened by:  Nothing tried Associated symptoms: no shortness of breath     Past Medical History  Diagnosis Date  . Nephrolithiasis   . Coronary artery disease   . Hematoma     Subcapsule renal  . Heart attack (HCC)   . PVC (premature ventricular contraction)   . HTN (hypertension)   . Diverticulosis   . Hyperlipidemia    Past Surgical History  Procedure Laterality Date  . Kidney stents    . Coronary artery bypass graft      LIMA to the LAD, SVG to ramus intermediate, SVG to right coronary artery 08/2012  . Rotator cufff Right   .  Hemorrhoid surgery      x 2  . Vasectomy    . Inguinal hernia repair Bilateral   . Lithotripsy      x 3  . Cardiac surgery     Family History  Problem Relation Age of Onset  . Coronary artery disease Father [id number]  . Heart disease Father   . Heart attack Maternal Grandfather   . Prostate cancer Paternal Grandfather   . Ovarian cancer Paternal Grandmother     mets  . Hypertension Mother   . Arthritis/Rheumatoid Mother   . Emphysema Mother   . COPD Mother   . Neuropathy Brother   . Liver disease Brother   . Healthy Brother    Social History  Substance Use Topics  . Smoking status: Current Every Day Smoker -- 0.25 packs/day for 25 years    Types: Cigarettes  . Smokeless tobacco: Never Used  . Alcohol Use: No    Review of Systems  Respiratory: Negative for shortness of breath.   Cardiovascular: Positive for chest pain.  All other systems reviewed and are negative.     Allergies  Codeine; Demerol; Dilaudid; and Morphine and related  Home Medications   Prior to Admission medications   Medication Sig Start Date End Date Taking? Authorizing Provider  aspirin EC 81 MG tablet Take 81 mg by mouth daily. 10/02/12   Rollene RotundaJames Hochrein, MD  atorvastatin (LIPITOR) 40 MG tablet TAKE 1  TABLET BY MOUTH EVERY DAY 02/18/15   Lyn Records, MD  fluticasone [facility]) 50 MCG/ACT nasal spray Place 2 sprays into both nostrils every evening.    Historical Provider, MD  loratadine (CLARITIN) 10 MG tablet Take 10 mg by mouth at bedtime.     Historical Provider, MD  pantoprazole (PROTONIX) 40 MG tablet TAKE 1 TABLET BY MOUTH DAILY 08/24/14   Louis Meckel, MD  pantoprazole (PROTONIX) 40 MG tablet TAKE 1 TABLET BY MOUTH DAILY 04/19/15   Louis Meckel, MD  tamsulosin (FLOMAX) 0.4 MG CAPS capsule Take 0.4 mg by mouth daily.    Historical Provider, MD   BP 123/89 mmHg  Pulse 77  Temp(Src) 97.9 F (36.6 C) (Oral)  Resp 18  Ht  (1.727 m)  Wt 200 lb (90.719 kg)  BMI 30.42 kg/m2  SpO2  100% Physical Exam  Constitutional: He is oriented to person, place, and time. He appears well-developed and well-nourished. No distress.  HENT:  Head: Normocephalic and atraumatic.  Neck: Normal range of motion. Neck supple.  Cardiovascular: Normal rate, regular rhythm and normal heart sounds.   No murmur heard. Pulmonary/Chest: Effort normal and breath sounds normal. No respiratory distress. He has no wheezes. He has no rales.  Abdominal: Soft. Bowel sounds are normal. He exhibits no distension. There is no tenderness.  Musculoskeletal: Normal range of motion. He exhibits no edema.  Neurological: He is alert and oriented to person, place, and time.  Skin: Skin is warm and dry. He is not diaphoretic.  Nursing note and vitals reviewed.   ED Course  Procedures (including critical care time) Labs Review Labs Reviewed  CBC WITH DIFFERENTIAL/PLATELET  TROPONIN I  BASIC METABOLIC PANEL    Imaging Review No results found. I have personally reviewed and evaluated these images and lab results as part of my medical decision-making.   EKG Interpretation   Date/Time:  Wednesday July 20 2015 15:08:19 EST Ventricular Rate:  75 PR Interval:  156 QRS Duration: 94 QT Interval:  374 QTC Calculation: 418 R Axis:   79 Text Interpretation:  Sinus rhythm Anterior infarct, old Confirmed by Edynn Gillock   MD, Kyrstan Gotwalt ([id number]) on 07/20/2015 3:19:54 PM      MDM   Final diagnoses:  None    Workup thus far is unremarkable. His EKG is unchanged and troponin is negative. Due to his strong cardiac history, he will be admitted for observation and rule out of MI.    Geoffery Lyons, MD 07/20/15 [phone]

## 2015-07-21 DIAGNOSIS — N200 Calculus of kidney: Secondary | ICD-10-CM | POA: Diagnosis not present

## 2015-07-21 DIAGNOSIS — Z720 Tobacco use: Secondary | ICD-10-CM | POA: Diagnosis not present

## 2015-07-21 DIAGNOSIS — I2510 Atherosclerotic heart disease of native coronary artery without angina pectoris: Secondary | ICD-10-CM | POA: Diagnosis not present

## 2015-07-21 DIAGNOSIS — F17210 Nicotine dependence, cigarettes, uncomplicated: Secondary | ICD-10-CM | POA: Diagnosis not present

## 2015-07-21 DIAGNOSIS — R079 Chest pain, unspecified: Secondary | ICD-10-CM | POA: Diagnosis not present

## 2015-07-21 LAB — TROPONIN I
Troponin I: <0.03 ng/mL (ref <0.031)
Troponin I: <0.03 ng/mL (ref <0.031)
Troponin I: <0.03 ng/mL (ref <0.031)

## 2015-07-21 LAB — D-DIMER, QUANTITATIVE: D-Dimer, Quant: <0.27 ug/mL-FEU (ref 0.00–0.50)

## 2015-07-21 MED ORDER — DIAZEPAM 2 MG PO TABS
2.0000 mg | ORAL_TABLET | Freq: Three times a day (TID) | ORAL | Status: DC | PRN
  Administered 2015-07-21 (×2): 2 mg via ORAL
  Filled 2015-07-21 (×2): qty 1

## 2015-07-21 MED ORDER — HYDROCODONE-ACETAMINOPHEN 5-325 MG PO TABS
1.0000 | ORAL_TABLET | ORAL | Status: DC | PRN
  Filled 2015-07-21: qty 2

## 2015-07-21 MED ORDER — METHOCARBAMOL 500 MG PO TABS
500.0000 mg | ORAL_TABLET | Freq: Four times a day (QID) | ORAL | Status: DC | PRN
  Administered 2015-07-21: 500 mg via ORAL
  Filled 2015-07-21: qty 1

## 2015-07-21 MED ORDER — KETOROLAC TROMETHAMINE 15 MG/ML IJ SOLN
15.0000 mg | Freq: Once | INTRAMUSCULAR | Status: AC
  Administered 2015-07-21: 15 mg via INTRAMUSCULAR
  Filled 2015-07-21: qty 1

## 2015-07-21 NOTE — Progress Notes (Signed)
Patient tried GI cocktail c/o intermittent mid right chest pain 10/10.  Dr. Irene LimboGoodrich notified.  IV Robaxin changed to po route.  Patient willing to try Robaxin.

## 2015-07-21 NOTE — Discharge Summary (Signed)
Physician Discharge Summary  Richardine ServiceJames M Hargrove WUJ:[id number] DOB: 07-Dec-1970 DOA: 07/20/2015  PCP: Juliette AlcideBURDINE,STEVEN E, MD  Admit date: 07/20/2015 Discharge date: 07/21/2015  Recommendations for Outpatient Follow-up:   Follow up with Cardiology for outpatient stress test.     Follow-up Information    Follow up with Juliette AlcideBURDINE,STEVEN E, MD.   Why:  As needed   Contact information:   [address] [place] [address] [phone]       Follow up with Dina RichBranch, Jonathan, MD.   Specialty:  Cardiology   Why:  office will contact you with appointment for stress test   Contact information:   [address] [place] [address] [phone]       Discharge Diagnoses:  1. Atypical chest pain  2. CAD 3. HLD 4. Tobacco use disorder  Discharge Condition: Improved Disposition: Discharge home   Diet recommendation: Heart healthy  Filed Weights   07/20/15 1511 07/20/15 1900  Weight: 90.719 kg (200 lb) 92.398 kg (203 lb 11.2 oz)    History of present illness:  [age] yom with past medical history of MI presented with right lower anterior chest wall pain. Chest pain intermittent, non exertional and described as different then MI. CXR and troponin in the ED unremarkable. Admitted for further evaluation and monitoring.   Hospital Course:  ACS was ruled out with negative cardiac markers, EKG non acute and troponin negative. D-dimer negative. Likely muscular or skeletal in nature based on exam. Cardiology evaluated and recommended short course of NSAID (ibuprofen 400mg  tid x 5 days) and to stay hydrated. Follow up with cardiology as outpatient for exercise nuclear test.   Individual issues as below:  1. Atypical chest pain. Improved. Suspect musculoskeletal. Reproducible on palpation. ACS ruled out with negative cardiac markers, EKG nonacute. Well's score = 0. D-dimer negative. Appreciate cardiology input.  2. CAD, PMH MI, s/p 3 vessel CABG 3. Tobacco use disorder.    Consultants:  Cardiology   Procedures:  None  Antibiotics:  None  Discharge Instructions Discharge Instructions    Activity as tolerated - No restrictions    Complete by:  As directed      Diet - low sodium heart healthy    Complete by:  As directed      Discharge instructions    Complete by:  As directed   Call your physician or seek immediate medical attention for chest pain, fever, shortness of breath or worsening of condition.            Discharge Medication List as of 07/22/2015  1:11 PM    START taking these medications   Details  ibuprofen (ADVIL,MOTRIN) 400 MG tablet Take 1 tablet (400 mg total) by mouth 3 (three) times daily. For 5 days. Stay well-hydrated., Starting 07/22/2015, Until Discontinued, No Print      CONTINUE these medications which have NOT CHANGED   Details  aspirin EC 81 MG tablet Take 81 mg by mouth daily., Starting 10/02/2012, Until Discontinued, Historical Med    atorvastatin (LIPITOR) 40 MG tablet TAKE 1 TABLET BY MOUTH EVERY DAY, Normal    fluticasone (FLONASE) 50 MCG/ACT nasal spray Place 2 sprays into both nostrils every evening., Until Discontinued, Historical Med    loratadine (CLARITIN) 10 MG tablet Take 10 mg by mouth at bedtime. , Until Discontinued, Historical Med    pantoprazole (PROTONIX) 40 MG tablet TAKE 1 TABLET BY MOUTH DAILY, Normal    tamsulosin (FLOMAX) 0.4 MG CAPS capsule Take 0.4 mg by mouth daily., Until  Discontinued, Historical Med       Allergies  Allergen Reactions  . Codeine Nausea And Vomiting  . Demerol [Meperidine] Other (See Comments)    Becomes angry   . Dilaudid [Hydromorphone Hcl] Nausea And Vomiting    IV  . Morphine And Related Nausea And Vomiting    The results of significant diagnostics from this hospitalization (including imaging, microbiology, ancillary and laboratory) are listed below for reference.    Significant Diagnostic Studies: Dg Chest Portable 1 View  07/20/2015  CLINICAL  DATA:  Chest pain for 3 hours. History of myocardial infarction and CABG. Initial encounter. EXAM: PORTABLE CHEST 1 VIEW COMPARISON:  03/28/2015 and 12/06/2012. FINDINGS: 1523 hours. There are lower lung volumes with mild resulting atelectasis at both lung bases. No edema, confluent airspace opacity, pleural effusion or pneumothorax demonstrated. The heart size and mediastinal contours are stable status post median sternotomy and CABG. The bones appear unchanged. IMPRESSION: Mild bibasilar atelectasis.  Otherwise stable chest. Electronically Signed   By: Carey Bullocks M.D.   On: 07/20/2015 15:38   Labs: Basic Metabolic Panel:  Recent Labs Lab 07/20/15 1518  NA 140  K 3.8  CL 108  CO2 26  GLUCOSE 99  BUN 11  CREATININE 1.02  CALCIUM 8.8*  CBC:  Recent Labs Lab 07/20/15 1518  WBC 7.9  NEUTROABS 4.1  HGB 13.8  HCT 41.1  MCV 90.9  PLT 229   Cardiac Enzymes:  Recent Labs Lab 07/20/15 2145 07/21/15 0059 07/21/15 1403 07/21/15 1912 07/22/15 0138  TROPONINI <0.03 <0.03 <0.03 <0.03 <0.03    Principal Problem:   Chest pain at rest Active Problems:   CAD (coronary artery disease) of artery bypass graft   Hyperlipemia   Esophageal reflux   BPH (benign prostatic hyperplasia)   Tobacco use   Time coordinating discharge: 20 minutes   Signed:  Brendia Sacks, MD [facility] 07/21/2015, 11:38 AM    By signing my name below, I, [name] attest that this documentation has been prepared under the direction and in the presence of Brendia Sacks, MD Electronically signed: [name]  07/21/2015 12:07  I personally performed the services described in this documentation. All medical record entries made by the scribe were at my direction. I have reviewed the chart and agree that the record reflects my personal performance and is accurate and complete. Brendia Sacks, MD

## 2015-07-21 NOTE — Progress Notes (Signed)
PROGRESS NOTE  Juan Wade [id number] DOB: 07/06/1971 DOA: 07/20/2015 PCP: Juliette AlcideBURDINE,STEVEN E, MD  cardiologist is Garnette ScheuermannHank Wade in [place]   Summary: [age] yom with past medical history of MI presented with right lower anterior chest wall pain. Chest pain intermittent, non exertional and described as different then MI. CXR and troponin in the ED unremarkable. Admitted for further evaluation and monitoring.   Assessment/Plan: 1. Chest pain. Mostly atypical. Suspect musculoskeletal. Reproducible on palpation of right center chest. Relieved with Valium last night. ACS ruled out with negative cardiac markers, EKG nonacute. Well's score = 0. Will check ddimer given pleuritic pain.  2. CAD, PMH MI, s/p 3 vessel CABG 3. HLD. Continue statin 4. BPH. Continue flomax. 5. GERD. Continue PPI 6. Tobacco use disorder. Continue nicotine patch. 7. History of DVT, per wife. [place] have to [place]. No evidence to suggest blood clots.    Appears clinically stable but continues to have pain and not ready to go home. Cardiac seems less likely than muscular etiology.  Check d-dimer, cycle troponin. Will monitor on telemetry and seek cardiology opinion 11/25.  Code Status: Full DVT prophylaxis: Lovenox Family Communication: Wife bedside. Discussed with patient who understands and has no concerns at this time. Disposition Plan: Anticipate discharge within next 24 hours.   Brendia Sacksaniel Kauan Kloosterman, MD  [facility]  Pager [phone] If 7PM-7AM, please contact night-coverage at [email or web address], password [email or web address] 07/21/2015, 7:18 AM    Consultants:    Procedures:    Antibiotics:    HPI/Subjective: Yesterday when sitting down experienced stabbing, intermittent, right chest pain. Pain radiated across chest and last seconds to minutes. Severity between moderate and severe.   Many transient episodes of CP overnight. Pain with movement of right arm and deep breath.  Was in usual state  of health before yesterday. Pain is different then heart attack from one year ago. Denies muscular strain, or recent travel.   Objective: Filed Vitals:   07/20/15 1845 07/20/15 1900 07/21/15 0000 07/21/15 0405  BP:  113/72 98/64 100/58  Pulse: 69 69 78 78  Temp:  97.9 F (36.6 C) 98.1 F (36.7 C) 98.1 F (36.7 C)  TempSrc:  Oral Oral Oral  Resp: 19 20 17 19   Height:  5\' 8"  (1.727 m)    Weight:  92.398 kg (203 lb 11.2 oz)    SpO2: 98% 99% 93% 98%   No intake or output data in the 24 hours ending 07/21/15 0718   Filed Weights   07/20/15 1511 07/20/15 1900  Weight: 90.719 kg (200 lb) 92.398 kg (203 lb 11.2 oz)    Exam:  Afebrile, VSS, not hypoxic  General:  Appears calm and comfortable.  Cardiovascular: RRR, no m/r/g. No LE edema. Telemetry: SR, no arrhythmias  Respiratory: CTA bilaterally, no w/r/r. Normal respiratory effort. Psychiatric: grossly normal mood and affect, speech fluent and appropriate  New data reviewed:  Troponins negative thus far   EKG independently reviewed SR, no acute changes  CXR NAD  Scheduled Meds: . aspirin EC  81 mg Oral Daily  . atorvastatin  40 mg Oral Daily  . enoxaparin (LOVENOX) injection  40 mg Subcutaneous Q24H  . nicotine  14 mg Transdermal Daily  . pantoprazole  40 mg Oral Daily  . sodium chloride  3 mL Intravenous Q12H  . sodium chloride  3 mL Intravenous Q12H  . tamsulosin  0.4 mg Oral Daily   Continuous Infusions:   Principal Problem:   Chest pain at rest Active Problems:  CAD (coronary artery disease) of artery bypass graft   Hyperlipemia   Esophageal reflux   BPH (benign prostatic hyperplasia)   Tobacco use   Time spent 25 minutes    By signing my name below, I, [name] attest that this documentation has been prepared under the direction and in the presence of Brendia Sacks, MD Electronically signed: [name]  07/21/2015 1:27PM   I personally performed the services described in this  documentation. All medical record entries made by the scribe were at my direction. I have reviewed the chart and agree that the record reflects my personal performance and is accurate and complete. Brendia Sacks, MD

## 2015-07-22 DIAGNOSIS — R079 Chest pain, unspecified: Secondary | ICD-10-CM | POA: Diagnosis not present

## 2015-07-22 DIAGNOSIS — Z720 Tobacco use: Secondary | ICD-10-CM | POA: Diagnosis not present

## 2015-07-22 LAB — TROPONIN I: Troponin I: <0.03 ng/mL (ref <0.031)

## 2015-07-22 MED ORDER — IBUPROFEN 400 MG PO TABS
400.0000 mg | ORAL_TABLET | Freq: Three times a day (TID) | ORAL | Status: DC
Start: 2015-07-22 — End: 2015-07-22
  Administered 2015-07-22: 400 mg via ORAL
  Filled 2015-07-22: qty 1

## 2015-07-22 MED ORDER — IBUPROFEN 400 MG PO TABS: 400.0000 mg | ORAL_TABLET | Freq: Three times a day (TID) | ORAL | Status: DC

## 2015-07-22 NOTE — Consult Note (Signed)
Primary cardiologist: Dr Garnette Scheuermann Consulting cardiologist:Dr Dina Rich  Clinical Summary Mr. Juan Wade is a 44 y.o.male history of CAD with prior CABG Jan 2014 (LIMA-LAD,SVG-ramus, SVG-RCA), chronic diastolic HF, HL, HTN admitted with chest pain. On Wednesday around noon while sitting in his car had sudden onset of sharp stabbing 10/10 pain right lower rib cage with + SOB. Worst with deep breaths and movement. Lasted several hours consistently, and now has chronic mild lingering pain over that area worst with palpation. Does note some DOE over the last few months with activity.    K 3.8, Cr 1, Hgb 13.8, D-dimer neg, trop neg x4 CXR no acute process EKG SR, old anteroseptal infarct 11/2014 echo LVEF 60-65%, no WMAs, grade I diastolic dysfunction  Allergies  Allergen Reactions  . Codeine Nausea And Vomiting  . Demerol [Meperidine] Other (See Comments)    Becomes angry   . Dilaudid [Hydromorphone Hcl] Nausea And Vomiting    IV  . Morphine And Related Nausea And Vomiting    Medications Scheduled Medications: . aspirin EC  81 mg Oral Daily  . atorvastatin  40 mg Oral Daily  . enoxaparin (LOVENOX) injection  40 mg Subcutaneous Q24H  . nicotine  14 mg Transdermal Daily  . pantoprazole  40 mg Oral Daily  . sodium chloride  3 mL Intravenous Q12H  . sodium chloride  3 mL Intravenous Q12H  . tamsulosin  0.4 mg Oral Daily     Infusions:     PRN Medications:  sodium chloride, acetaminophen **OR** acetaminophen, diazepam, diclofenac sodium, gi cocktail, HYDROcodone-acetaminophen, methocarbamol, ondansetron **OR** ondansetron (ZOFRAN) IV, sodium chloride   Past Medical History  Diagnosis Date  . Nephrolithiasis   . Coronary artery disease   . Hematoma     Subcapsule renal  . Heart attack (HCC)   . PVC (premature ventricular contraction)   . HTN (hypertension)   . Diverticulosis   . Hyperlipidemia     Past Surgical History  Procedure Laterality Date  . Kidney  stents    . Coronary artery bypass graft      LIMA to the LAD, SVG to ramus intermediate, SVG to right coronary artery 08/2012  . Rotator cufff Right   . Hemorrhoid surgery      x 2  . Vasectomy    . Inguinal hernia repair Bilateral   . Lithotripsy      x 3  . Cardiac surgery      Family History  Problem Relation Age of Onset  . Coronary artery disease Father 29  . Heart disease Father   . Heart attack Maternal Grandfather   . Prostate cancer Paternal Grandfather   . Ovarian cancer Paternal Grandmother     mets  . Hypertension Mother   . Arthritis/Rheumatoid Mother   . Emphysema Mother   . COPD Mother   . Neuropathy Brother   . Liver disease Brother   . Healthy Brother     Social History Mr. Lizana reports that he has been smoking Cigarettes.  He has a 6.25 pack-year smoking history. He has never used smokeless tobacco. Mr. Antolin reports that he does not drink alcohol.  Review of Systems CONSTITUTIONAL: No weight loss, fever, chills, weakness or fatigue.  HEENT: Eyes: No visual loss, blurred vision, double vision or yellow sclerae. No hearing loss, sneezing, congestion, runny nose or sore throat.  SKIN: No rash or itching.  CARDIOVASCULAR: per HPI RESPIRATORY: No shortness of breath, cough or sputum.  GASTROINTESTINAL: No anorexia, nausea,  vomiting or diarrhea. No abdominal pain or blood.  GENITOURINARY: no polyuria, no dysuria NEUROLOGICAL: No headache, dizziness, syncope, paralysis, ataxia, numbness or tingling in the extremities. No change in bowel or bladder control.  MUSCULOSKELETAL: No muscle, back pain, joint pain or stiffness.  HEMATOLOGIC: No anemia, bleeding or bruising.  LYMPHATICS: No enlarged nodes. No history of splenectomy.  PSYCHIATRIC: No history of depression or anxiety.      Physical Examination Blood pressure 107/66, pulse 74, temperature 97.7 F (36.5 C), temperature source Oral, resp. rate 18, height 5\' 8"  (1.727 m), weight 203 lb 11.2 oz  (92.398 kg), SpO2 98 %.  Intake/Output Summary (Last 24 hours) at 07/22/15 1009 Last data filed at 07/21/15 2342  Gross per 24 hour  Intake    960 ml  Output      0 ml  Net    960 ml    HEENT: sclera clear  Cardiovascular: RRR, no m/r/g, no jvd  Respiratory:CTAB  GI: abdomen soft, NT, ND  MSK: right lower rib cage tender to palpation  Neuro: no focal deficits  Psych: appropriate affect   Lab Results  Basic Metabolic Panel:  Recent Labs Lab 07/20/15 1518  NA 140  K 3.8  CL 108  CO2 26  GLUCOSE 99  BUN 11  CREATININE 1.02  CALCIUM 8.8*    Liver Function Tests: No results for input(s): AST, ALT, ALKPHOS, BILITOT, PROT, ALBUMIN in the last 168 hours.  CBC:  Recent Labs Lab 07/20/15 1518  WBC 7.9  NEUTROABS 4.1  HGB 13.8  HCT 41.1  MCV 90.9  PLT 229    Cardiac Enzymes:  Recent Labs Lab 07/20/15 2145 07/21/15 0059 07/21/15 1403 07/21/15 1912 07/22/15 0138  TROPONINI <0.03 <0.03 <0.03 <0.03 <0.03    BNP: Invalid input(s): POCBNP     Impression/Recommendations  1. CAD/Chest pain - presents with very atypical chest pain of right lower rib cage that is worst with movement/deep breathing, reproducible with palpation, and ongoing for several hours constantly. EKG and enzymes negative for ACS. Suspect costochondritis, recommend short course of NSAID use (short course given his CAD history). Would treat with ibuprofen 400mg  tid x 5 days, counseled to stay well hydrated while taking.  - given his few month history of worsening SOB and DOE we will arrange an outpatient exercise nuclear stress test.   - ok for discharge from cardiac standpoint. We will arrange outpatient exercise nuclear stress for next week and outpatient follow up.    Dina RichJonathan Norberto Wishon, M.D

## 2015-07-22 NOTE — Progress Notes (Signed)
Cardiology consult ordered, [facility], per secretary, Dr. Irene LimboGoodrich will have to go thru [facility] and talk to cardiologist doctor to doctor.  [facility]  Returned call and informed me that cardiologist in ICU and will be rounding.  Secretary made aware.

## 2015-07-22 NOTE — Progress Notes (Signed)
Complained of nausea, Zofran PO given, patient reported some relief with cola.  Will continue to monitor.

## 2015-07-22 NOTE — Progress Notes (Signed)
Discharge instructions reviewed with patient and his wife, questions answered, understanding verbalized.  [facility], stable at discharge.

## 2015-07-22 NOTE — Care Management Note (Signed)
Case Management Note  Patient Details  Name: Richardine ServiceJames M Zapata MRN: [id number] Date of Birth: [birth year]  Subjective/Objective:                  Pt admitted from home with CP. Pt lives with his wife and will return home at discharge. Pt is independent with ADL's.  Action/Plan: No CM needs noted.  Expected Discharge Date:                  Expected Discharge Plan:  Home/Self Care  In-House Referral:  NA  Discharge planning Services  CM Consult  Post Acute Care Choice:  NA Choice offered to:  NA  DME Arranged:    DME Agency:     HH Arranged:    HH Agency:     Status of Service:  Completed, signed off  Medicare Important Message Given:    Date Medicare IM Given:    Medicare IM give by:    Date Additional Medicare IM Given:    Additional Medicare Important Message give by:     If discussed at Long Length of Stay Meetings, dates discussed:    Additional Comments:  Cheryl FlashBlackwell, Psalm Schappell Crowder, RN 07/22/2015, 11:03 AM

## 2015-07-22 NOTE — Progress Notes (Signed)
Central telemetry notified patient being discharged, telemetry removed.  IV access removed.

## 2015-07-22 NOTE — Progress Notes (Signed)
PROGRESS NOTE  Richardine ServiceJames M Cheong ATF:[id number] DOB: 1970/12/16 DOA: 07/20/2015 PCP: Juliette AlcideBURDINE,STEVEN E, MD  cardiologist is Garnette Scheu[place]ank [place]   Summary: [age] yom with past medical history of MI presented with right lower anterior chest wall pain. Chest pain intermittent, non exertional and described as different then MI. CXR and troponin in the ED unremarkable. Admitted for further evaluation and monitoring.   Assessment/Plan: 1. Atypical chest pain. Improved. Suspect musculoskeletal. Reproducible on palpation. ACS ruled out with negative cardiac markers, EKG nonacute. Well's score = 0. D-dimer negative. Appreciate cardiology input.  2. CAD, PMH MI, s/p 3 vessel CABG 3. Tobacco use disorder.   4.    Improved, evaluated by cardiology, plan for outpatient stress test  Continue pain management with short course of NSAID and stay hydrated.   Code Status: Full DVT prophylaxis: Lovenox Family Communication: Discussed with patient who understands and has no concerns at this time. Disposition Plan: Discharge home today.  Brendia Sacksaniel Goodrich, MD  [facility]  Pager [phone] If 7PM-7AM, please contact night-coverage at [email or web address], password [email or web address] 07/22/2015, 7:21 AM    Consultants:    Procedures:    Antibiotics:    HPI/Subjective: Feels better, still some chest soreness with movement.   Objective: Filed Vitals:   07/21/15 0825 07/21/15 1153 07/21/15 1900 07/22/15 0424  BP: 100/65 112/65 118/78 107/66  Pulse: 81 71 84 74  Temp: 97.7 F (36.5 C) 98.4 F (36.9 C) 97.9 F (36.6 C) 97.7 F (36.5 C)  TempSrc: Oral Oral Oral Oral  Resp: 20 20 18 18   Height:      Weight:      SpO2: 98% 97% 98% 98%    Intake/Output Summary (Last 24 hours) at 07/22/15 0721 Last data filed at 07/21/15 2342  Gross per 24 hour  Intake   1200 ml  Output      0 ml  Net   1200 ml     Filed Weights   07/20/15 1511 07/20/15 1900  Weight: 90.719 kg (200 lb) 92.398 kg (203 lb 11.2 oz)     Exam:  Afebrile, VSS, not hypoxic  General:  Appears calm and comfortable Cardiovascular: RRR, no m/r/g. No LE edema. Telemetry: SR Respiratory: CTA bilaterally, no w/r/r. Normal respiratory effort. Psychiatric: grossly normal mood and affect, speech fluent and appropriate   New data reviewed:  Troponins negative    D-dimer <.27  Scheduled Meds: . aspirin EC  81 mg Oral Daily  . atorvastatin  40 mg Oral Daily  . enoxaparin (LOVENOX) injection  40 mg Subcutaneous Q24H  . nicotine  14 mg Transdermal Daily  . pantoprazole  40 mg Oral Daily  . sodium chloride  3 mL Intravenous Q12H  . sodium chloride  3 mL Intravenous Q12H  . tamsulosin  0.4 mg Oral Daily   Continuous Infusions:   Principal Problem:   Chest pain at rest Active Problems:   CAD (coronary artery disease) of artery bypass graft   Hyperlipemia   Esophageal reflux   BPH (benign prostatic hyperplasia)   Tobacco use     By signing my name below, I, Zadie CleverlyJessica Augustus attest that this documentation has been prepared under the direction and in the presence of Brendia Sacksaniel Goodrich, MD Electronically signed: Zadie CleverlyJessica Augustus  07/22/2015   I personally performed the services described in this documentation. All medical record entries made by the scribe were at my direction. I have reviewed the chart and agree that the record reflects my personal performance and is accurate and complete.  Murray Hodgkins, MD

## 2015-07-25 ENCOUNTER — Telehealth: Payer: Self-pay | Admitting: Interventional Cardiology

## 2015-07-25 DIAGNOSIS — R079 Chest pain, unspecified: Secondary | ICD-10-CM

## 2015-07-25 NOTE — Telephone Encounter (Signed)
New Message  Pt is still having the same issues. Pt wife called states that the pt was recently in the hospital. A stress test was ordered. [facility].  Request a call back to determine when the test can be ordered. Please assist

## 2015-07-26 NOTE — Telephone Encounter (Signed)
Returned pt call. Pt was seen at [facility] on 07/22/15 by Dr.Branch who recommended pt have a outpatient stress myoview. [place]. Adv pt that a scheduler from our office will call him to schedule. Pt verbalized understanding.

## 2015-07-27 ENCOUNTER — Other Ambulatory Visit: Payer: Self-pay | Admitting: Cardiology

## 2015-07-27 DIAGNOSIS — R0789 Other chest pain: Secondary | ICD-10-CM

## 2015-07-29 ENCOUNTER — Encounter (HOSPITAL_COMMUNITY)
Admission: RE | Admit: 2015-07-29 | Discharge: 2015-07-29 | Disposition: A | Discharge status: Home / Self Care | Payer: Medicaid Other | Source: Ambulatory Visit | Attending: Cardiology | Admitting: Cardiology

## 2015-07-29 ENCOUNTER — Encounter (HOSPITAL_COMMUNITY): Payer: Self-pay

## 2015-07-29 ENCOUNTER — Inpatient Hospital Stay (HOSPITAL_COMMUNITY): Admission: RE | Admit: 2015-07-29 | Payer: Medicaid Other | Source: Ambulatory Visit

## 2015-07-29 DIAGNOSIS — R0789 Other chest pain: Secondary | ICD-10-CM | POA: Insufficient documentation

## 2015-07-29 LAB — NM MYOCAR MULTI W/SPECT W/WALL MOTION / EF
CHL CUP NUCLEAR SDS: 1
CHL CUP NUCLEAR SRS: 0
CHL CUP NUCLEAR SSS: 1
CSEPEW: 7 METS
Exercise duration (min): 5 min
Exercise duration (sec): 9 s
LHR: 0.39
LV sys vol: 30 mL
LVDIAVOL: 68 mL
MPHR: 174 {beats}/min
Peak HR: 129 {beats}/min
Percent HR: 74 %
RPE: 14
Rest HR: 75 {beats}/min
TID: 0.96

## 2015-07-29 MED ORDER — TECHNETIUM TC 99M SESTAMIBI GENERIC - CARDIOLITE
30.0000 | Freq: Once | INTRAVENOUS | Status: AC | PRN
  Administered 2015-07-29: 31.5 via INTRAVENOUS

## 2015-07-29 MED ORDER — TECHNETIUM TC 99M SESTAMIBI - CARDIOLITE
10.0000 | Freq: Once | INTRAVENOUS | Status: AC | PRN
  Administered 2015-07-29: 11 via INTRAVENOUS

## 2015-07-29 MED ORDER — REGADENOSON 0.4 MG/5ML IV SOLN
INTRAVENOUS | Status: AC
  Administered 2015-07-29: 0.4 mg via INTRAVENOUS
  Filled 2015-07-29: qty 5

## 2015-07-29 MED ORDER — SODIUM CHLORIDE 0.9 % IJ SOLN
INTRAMUSCULAR | Status: AC
  Administered 2015-07-29: 10 mL via INTRAVENOUS
  Filled 2015-07-29: qty 3

## 2015-08-02 ENCOUNTER — Encounter (HOSPITAL_COMMUNITY): Payer: Medicaid Other

## 2015-08-17 ENCOUNTER — Other Ambulatory Visit: Payer: Self-pay | Admitting: Gastroenterology

## 2015-09-16 ENCOUNTER — Other Ambulatory Visit: Payer: Self-pay | Admitting: Gastroenterology

## 2015-09-16 ENCOUNTER — Other Ambulatory Visit: Payer: Self-pay | Admitting: Interventional Cardiology

## 2015-11-04 ENCOUNTER — Other Ambulatory Visit: Payer: Self-pay | Admitting: Interventional Cardiology

## 2015-11-04 NOTE — Telephone Encounter (Signed)
atorvastatin (LIPITOR) 40 MG tablet  Medication   Date: 09/16/2015  [facility] [address] [facility]  Ordering/Authorizing: Lyn RecordsHenry W Smith, MD      Order Providers    Prescribing Provider Encounter Provider   Lyn RecordsHenry W Smith, MD Lyn RecordsHenry W Smith, MD    Medication Detail      Disp Refills Start End     atorvastatin (LIPITOR) 40 MG tablet 30 tablet 5 09/16/2015     Sig: TAKE 1 TABLET BY MOUTH EVERY DAY    Notes to Pharmacy: Generic [id number]:LIPITOR  40MG     E-Prescribing Status: Receipt confirmed by pharmacy (09/16/2015 1:26 PM EST)     Pharmacy    [facility] - [place] [place] - [address]   atorvastatin (LIPITOR) 40 MG tablet [Pharmacy Med Name: ATORVASTATIN 40MG  (TAB)]  TAKE 1 TABLET BY MOUTH EVERY DAY, Disp-30 tablet, R-5  Generic [id number]:LIPITOR 40MG  PLEASE SEND 90 DAY SUPPLY, THANKS  Normal  WILL RESEND IN 90 DAY AS REQUESTED.

## 2016-02-25 ENCOUNTER — Other Ambulatory Visit: Payer: Self-pay | Admitting: Interventional Cardiology

## 2016-02-27 ENCOUNTER — Other Ambulatory Visit: Payer: Self-pay

## 2016-02-27 MED ORDER — ATORVASTATIN CALCIUM 40 MG PO TABS: 40.0000 mg | ORAL_TABLET | Freq: Every day | ORAL | Status: DC

## 2016-02-27 NOTE — Telephone Encounter (Signed)
Ok to refill , pt needs to schedule an appointment

## 2016-02-27 NOTE — Telephone Encounter (Signed)
Refill request received for Lipitor 40 mg - 1 QD. Last OV with Dr. Katrinka BlazingSmith was 01/27/15 and pt was supposed to f/u in 1 year.  There are 2 recalls in the system for 01/27/16 but an appt has not been made yet. Please see Dr. Michaelle CopasSmith's assessment and plan from last OV (copied below) regarding his hyperlipemia. OK to refill Lipitor or should this go to Dr. Leandrew KoyanagiBurdine? Please advise  Hyperlipemia- no recent data. We need to obtain copies of recent blood work from Dr. Leandrew KoyanagiBurdine  Snoring and fatigue- needs a sleep study performed   Current medicines are reviewed at length with the patient today. The patient does not have concerns regarding medicines.  The following changes have been made: Decrease aspirin 81 mg daily. Increase aerobic activity  Labs/ tests ordered today include:  Orders Placed This Encounter  Procedures  . Split night study     Disposition: FU with HS in 1 year  Signed, Lesleigh NoeSMITH III,HENRY W, MD  01/27/2015 5:17 PM  [facility] [address], [place], [place] [address] Phone: [phone]) [phone]; Fax: [phone]) [phone]

## 2016-05-21 ENCOUNTER — Other Ambulatory Visit: Payer: Self-pay | Admitting: Interventional Cardiology

## 2016-05-22 ENCOUNTER — Other Ambulatory Visit: Payer: Self-pay

## 2016-05-22 MED ORDER — ATORVASTATIN CALCIUM 40 MG PO TABS
40.0000 mg | ORAL_TABLET | Freq: Every day | ORAL | 0 refills | Status: DC
Start: 2016-05-22 — End: 2016-11-20

## 2016-11-20 ENCOUNTER — Emergency Department (HOSPITAL_COMMUNITY): Payer: Self-pay

## 2016-11-20 ENCOUNTER — Inpatient Hospital Stay (HOSPITAL_COMMUNITY)
Admission: EM | Admit: 2016-11-20 | Discharge: 2016-11-23 | DRG: 069 | Disposition: A | Discharge status: Home / Self Care | Payer: Medicaid Other | Attending: Internal Medicine | Admitting: Internal Medicine

## 2016-11-20 ENCOUNTER — Encounter (HOSPITAL_COMMUNITY): Payer: Self-pay | Admitting: *Deleted

## 2016-11-20 DIAGNOSIS — R42 Dizziness and giddiness: Secondary | ICD-10-CM

## 2016-11-20 DIAGNOSIS — Z7982 Long term (current) use of aspirin: Secondary | ICD-10-CM

## 2016-11-20 DIAGNOSIS — I2510 Atherosclerotic heart disease of native coronary artery without angina pectoris: Secondary | ICD-10-CM | POA: Diagnosis present

## 2016-11-20 DIAGNOSIS — G459 Transient cerebral ischemic attack, unspecified: Principal | ICD-10-CM

## 2016-11-20 DIAGNOSIS — F17210 Nicotine dependence, cigarettes, uncomplicated: Secondary | ICD-10-CM | POA: Diagnosis present

## 2016-11-20 DIAGNOSIS — Z8249 Family history of ischemic heart disease and other diseases of the circulatory system: Secondary | ICD-10-CM

## 2016-11-20 DIAGNOSIS — Z7951 Long term (current) use of inhaled steroids: Secondary | ICD-10-CM

## 2016-11-20 DIAGNOSIS — J309 Allergic rhinitis, unspecified: Secondary | ICD-10-CM | POA: Diagnosis present

## 2016-11-20 DIAGNOSIS — R269 Unspecified abnormalities of gait and mobility: Secondary | ICD-10-CM

## 2016-11-20 DIAGNOSIS — E876 Hypokalemia: Secondary | ICD-10-CM | POA: Diagnosis present

## 2016-11-20 DIAGNOSIS — I25119 Atherosclerotic heart disease of native coronary artery with unspecified angina pectoris: Secondary | ICD-10-CM

## 2016-11-20 DIAGNOSIS — I5020 Unspecified systolic (congestive) heart failure: Secondary | ICD-10-CM | POA: Diagnosis present

## 2016-11-20 DIAGNOSIS — I10 Essential (primary) hypertension: Secondary | ICD-10-CM | POA: Diagnosis present

## 2016-11-20 DIAGNOSIS — I252 Old myocardial infarction: Secondary | ICD-10-CM

## 2016-11-20 DIAGNOSIS — E785 Hyperlipidemia, unspecified: Secondary | ICD-10-CM | POA: Diagnosis present

## 2016-11-20 DIAGNOSIS — R079 Chest pain, unspecified: Secondary | ICD-10-CM

## 2016-11-20 DIAGNOSIS — R0789 Other chest pain: Secondary | ICD-10-CM | POA: Diagnosis present

## 2016-11-20 DIAGNOSIS — I25810 Atherosclerosis of coronary artery bypass graft(s) without angina pectoris: Secondary | ICD-10-CM | POA: Diagnosis present

## 2016-11-20 DIAGNOSIS — H539 Unspecified visual disturbance: Secondary | ICD-10-CM

## 2016-11-20 LAB — BASIC METABOLIC PANEL
ANION GAP: 9 (ref 5–15)
BUN: 7 mg/dL (ref 6–20)
CALCIUM: 8.9 mg/dL (ref 8.9–10.3)
CO2: 22 mmol/L (ref 22–32)
CREATININE: 1.18 mg/dL (ref 0.61–1.24)
Chloride: 107 mmol/L (ref 101–111)
GFR calc Af Amer: >60 mL/min (ref >60)
GLUCOSE: 86 mg/dL (ref 65–99)
Potassium: 3.2 mmol/L — ABNORMAL LOW (ref 3.5–5.1)
Sodium: 138 mmol/L (ref 135–145)

## 2016-11-20 LAB — CBC
HCT: 45.1 % (ref 39.0–52.0)
HEMOGLOBIN: 15.7 g/dL (ref 13.0–17.0)
MCH: 31.8 pg (ref 26.0–34.0)
MCHC: 34.8 g/dL (ref 30.0–36.0)
MCV: 91.3 fL (ref 78.0–100.0)
PLATELETS: 189 10*3/uL (ref 150–400)
RBC: 4.94 MIL/uL (ref 4.22–5.81)
RDW: 12.8 % (ref 11.5–15.5)
WBC: 4.6 10*3/uL (ref 4.0–10.5)

## 2016-11-20 LAB — I-STAT TROPONIN, ED: TROPONIN I, POC: 0 ng/mL (ref 0.00–0.08)

## 2016-11-20 MED ORDER — SENNOSIDES-DOCUSATE SODIUM 8.6-50 MG PO TABS
1.0000 | ORAL_TABLET | Freq: Every evening | ORAL | Status: DC | PRN
  Filled 2016-11-20: qty 1

## 2016-11-20 MED ORDER — NITROGLYCERIN 0.4 MG SL SUBL: 0.4000 mg | SUBLINGUAL_TABLET | SUBLINGUAL | Status: DC | PRN

## 2016-11-20 MED ORDER — FLUTICASONE PROPIONATE 50 MCG/ACT NA SUSP
2.0000 | Freq: Every evening | NASAL | Status: DC
  Administered 2016-11-21: 2 via NASAL
  Filled 2016-11-20 (×2): qty 16

## 2016-11-20 MED ORDER — ENOXAPARIN SODIUM 40 MG/0.4ML ~~LOC~~ SOLN
40.0000 mg | SUBCUTANEOUS | Status: DC
  Administered 2016-11-21 – 2016-11-22 (×3): 40 mg via SUBCUTANEOUS
  Filled 2016-11-20 (×3): qty 0.4

## 2016-11-20 MED ORDER — SODIUM CHLORIDE 0.9 % IV SOLN
INTRAVENOUS | Status: AC
  Administered 2016-11-21: 01:00:00 via INTRAVENOUS

## 2016-11-20 MED ORDER — IBUPROFEN 600 MG PO TABS
600.0000 mg | ORAL_TABLET | Freq: Four times a day (QID) | ORAL | Status: DC | PRN
  Administered 2016-11-21 – 2016-11-22 (×3): 600 mg via ORAL
  Filled 2016-11-20 (×3): qty 1

## 2016-11-20 MED ORDER — ACETAMINOPHEN 325 MG PO TABS: 650.0000 mg | ORAL_TABLET | ORAL | Status: DC | PRN

## 2016-11-20 MED ORDER — POTASSIUM CHLORIDE CRYS ER 20 MEQ PO TBCR
40.0000 meq | EXTENDED_RELEASE_TABLET | Freq: Once | ORAL | Status: AC
  Administered 2016-11-20: 40 meq via ORAL
  Filled 2016-11-20: qty 2

## 2016-11-20 MED ORDER — ATORVASTATIN CALCIUM 40 MG PO TABS
40.0000 mg | ORAL_TABLET | Freq: Every day | ORAL | Status: DC
  Administered 2016-11-21 – 2016-11-22 (×2): 40 mg via ORAL
  Filled 2016-11-20 (×2): qty 1

## 2016-11-20 MED ORDER — SODIUM CHLORIDE 0.9 % IV SOLN
INTRAVENOUS | Status: DC
  Administered 2016-11-20: 21:00:00 via INTRAVENOUS

## 2016-11-20 MED ORDER — LABETALOL HCL 5 MG/ML IV SOLN: 5.0000 mg | INTRAVENOUS | Status: DC | PRN

## 2016-11-20 MED ORDER — ACETAMINOPHEN 650 MG RE SUPP: 650.0000 mg | RECTAL | Status: DC | PRN

## 2016-11-20 MED ORDER — ASPIRIN EC 81 MG PO TBEC
81.0000 mg | DELAYED_RELEASE_TABLET | Freq: Every day | ORAL | Status: DC
  Filled 2016-11-20 (×2): qty 1

## 2016-11-20 MED ORDER — ACETAMINOPHEN 160 MG/5ML PO SOLN: 650.0000 mg | ORAL | Status: DC | PRN

## 2016-11-20 MED ORDER — LORATADINE 10 MG PO TABS
10.0000 mg | ORAL_TABLET | Freq: Every day | ORAL | Status: DC
  Administered 2016-11-21 – 2016-11-22 (×3): 10 mg via ORAL
  Filled 2016-11-20 (×3): qty 1

## 2016-11-20 NOTE — ED Triage Notes (Signed)
Pt reports centralized chest pain/pressure x 2 days. Pt also c/o nausea, blurred vision, dizziness and headache as well. Pt had a 325 mg aspirin PTA.

## 2016-11-20 NOTE — ED Notes (Signed)
Patient transported to X-ray 

## 2016-11-20 NOTE — H&P (Signed)
History and Physical    Juan Wade:[id number] DOB: 11-Apr-1971 DOA: 11/20/2016  PCP: Juliette Alcide, MD   Patient coming from: Home  Chief Complaint: Chest pain, blurred vision, gait difficulty   HPI: Juan Wade is a 46 y.o. male with medical history significant for CAD status post CABG in 2014, and allergic rhinitis who presents to the emergency department with 2-3 days of intermittent chest pain, visual impairment, and gait instability. Patient reports that he had been in his usual state until approximately 2-3 days ago when he noted the insidious development of a dull achy pain at the lower sternum, occurring at both rest and with activity, lasting approximately 1 minute before easing off spontaneously, nonradiating, and not associated with dyspnea, nausea, or diaphoresis. He describes the location of the pain is the same as a chronic pain that he has experienced intermittently since the time of this CABG, but notes that the character of the pain is also little bit different. He describes the pain as not similar to his prior experiences with angina. He also notes that over the past 2 days, he has had a nonproductive cough without dyspnea, and without fevers or chills. In addition to this, the patient also complains of a transient visual disturbance which occurred yesterday and again this morning, involved the left eye primarily, and resolved spontaneously prior to arrival in the hospital. He also notes feeling off balance with ambulation for the past 2 days, but denies any fall or trauma, and reports that this has also spontaneously resolved. Patient chewed a full dose of aspirin prior to coming in.  ED Course: Upon arrival to the ED, patient is found to be saturating well on room air, and with vital signs stable. EKG features a sinus rhythm with low-voltage QRS and no significant change from prior. Chest x-ray is negative for acute cardiopulmonary disease and noncontrast head CT is  negative for acute intracranial abnormality. Chemistry panels notable for a potassium of 3.2 and CBC is unremarkable. Troponin is undetectable. Patient was monitored on telemetry in the emergency department with no apparent ischemic changes. There was no recurrence in his chest pain or visual disturbance while in the ED. He has remained hemodynamically stable and not in any acute respiratory distress, and will be observed on the telemetry unit for ongoing evaluation and management of atypical chest pain known CAD, and transient visual and gait disturbances with negative head CT and with deficits now resolved.  Review of Systems:  All other systems reviewed and apart from HPI, are negative.  Past Medical History:  Diagnosis Date  . Coronary artery disease   . Diverticulosis   . Heart attack   . Hematoma    Subcapsule renal  . HTN (hypertension)   . Hyperlipidemia   . Nephrolithiasis   . PVC (premature ventricular contraction)     Past Surgical History:  Procedure Laterality Date  . CARDIAC SURGERY    . CORONARY ARTERY BYPASS GRAFT     LIMA to the LAD, SVG to ramus intermediate, SVG to right coronary artery 08/2012  . HEMORRHOID SURGERY     x 2  . INGUINAL HERNIA REPAIR Bilateral   . Kidney stents    . LITHOTRIPSY     x 3  . Rotator cufff Right   . VASECTOMY       reports that he has been smoking Cigarettes.  He has a 6.25 pack-year smoking history. He has never used smokeless tobacco. He reports that he does  not drink alcohol or use drugs.  Allergies  Allergen Reactions  . Codeine Nausea And Vomiting  . Demerol [Meperidine] Other (See Comments)    Becomes angry   . Dilaudid [Hydromorphone Hcl] Nausea And Vomiting    IV  . Morphine And Related Nausea And Vomiting    Family History  Problem Relation Age of Onset  . Coronary artery disease Father 46  . Heart disease Father   . Heart attack Maternal Grandfather   . Prostate cancer Paternal Grandfather   . Ovarian cancer  Paternal Grandmother     mets  . Hypertension Mother   . Arthritis/Rheumatoid Mother   . Emphysema Mother   . COPD Mother   . Neuropathy Brother   . Liver disease Brother   . Healthy Brother      Prior to Admission medications   Medication Sig Start Date End Date Taking? Authorizing Provider  aspirin EC 81 MG tablet Take 81 mg by mouth daily. 10/02/12  Yes Rollene Rotunda, MD  fluticasone (FLONASE) 50 MCG/ACT nasal spray Place 2 sprays into both nostrils every evening.   Yes Historical Provider, MD  ibuprofen (ADVIL,MOTRIN) 400 MG tablet Take 1 tablet (400 mg total) by mouth 3 (three) times daily. For 5 days. Stay well-hydrated. 07/22/15  Yes Standley Brooking, MD  loratadine (CLARITIN) 10 MG tablet Take 10 mg by mouth at bedtime.    Yes Historical Provider, MD    Physical Exam: Vitals:   11/20/16 2130 11/20/16 2200 11/20/16 2230 11/20/16 2300  BP: 112/79 105/74 110/70 108/76  Pulse: 81 78 74 72  Resp: (!) 24 18 17 18   SpO2: 94% 92% 97% 96%  Weight:      Height:          Constitutional: NAD, calm, comfortable Eyes: PERTLA, lids and conjunctivae normal ENMT: Mucous membranes are moist. Posterior pharynx clear of any exudate or lesions.   Neck: normal, supple, no masses, no thyromegaly Respiratory: clear to auscultation bilaterally, no wheezing, no crackles. Normal respiratory effort.  Cardiovascular: S1 & S2 heard, regular rate and rhythm. No extremity edema. No significant JVD. Abdomen: No distension, no tenderness, no masses palpated. Bowel sounds normal.  Musculoskeletal: no clubbing / cyanosis. No joint deformity upper and lower extremities.    Skin: no significant rashes, lesions, ulcers. Warm, dry, well-perfused. Neurologic: CN 2-12 grossly intact. Sensation intact, DTR normal. Strength 5/5 in all 4 limbs.  Psychiatric: Alert and oriented x 3. Normal mood and affect.     Labs on Admission: I have personally reviewed following labs and imaging studies  CBC:  Recent  Labs Lab 11/20/16 2024  WBC 4.6  HGB 15.7  HCT 45.1  MCV 91.3  PLT 189   Basic Metabolic Panel:  Recent Labs Lab 11/20/16 2024  NA 138  K 3.2*  CL 107  CO2 22  GLUCOSE 86  BUN 7  CREATININE 1.18  CALCIUM 8.9   GFR: Estimated Creatinine Clearance: 84.6 mL/min (by C-G formula based on SCr of 1.18 mg/dL). Liver Function Tests: No results for input(s): AST, ALT, ALKPHOS, BILITOT, PROT, ALBUMIN in the last 168 hours. No results for input(s): LIPASE, AMYLASE in the last 168 hours. No results for input(s): AMMONIA in the last 168 hours. Coagulation Profile: No results for input(s): INR, PROTIME in the last 168 hours. Cardiac Enzymes: No results for input(s): CKTOTAL, CKMB, CKMBINDEX, TROPONINI in the last 168 hours. BNP (last 3 results) No results for input(s): PROBNP in the last 8760 hours. HbA1C: No results  for input(s): HGBA1C in the last 72 hours. CBG: No results for input(s): GLUCAP in the last 168 hours. Lipid Profile: No results for input(s): CHOL, HDL, LDLCALC, TRIG, CHOLHDL, LDLDIRECT in the last 72 hours. Thyroid Function Tests: No results for input(s): TSH, T4TOTAL, FREET4, T3FREE, THYROIDAB in the last 72 hours. Anemia Panel: No results for input(s): VITAMINB12, FOLATE, FERRITIN, TIBC, IRON, RETICCTPCT in the last 72 hours. Urine analysis: No results found for: COLORURINE, APPEARANCEUR, LABSPEC, PHURINE, GLUCOSEU, HGBUR, BILIRUBINUR, KETONESUR, PROTEINUR, UROBILINOGEN, NITRITE, LEUKOCYTESUR Sepsis Labs: @LABRCNTIP (procalcitonin:4,lacticidven:4) )No results found for this or any previous visit (from the past 240 hour(s)).   Radiological Exams on Admission: Dg Chest 2 View  Result Date: 11/20/2016 CLINICAL DATA:  Chest pain and pressure x2 days. History of myocardial infarction and coronary artery disease. EXAM: CHEST  2 VIEW COMPARISON:  07/20/2015 FINDINGS: The heart size and mediastinal contours are within normal limits. Median sternotomy with post CABG  change. Both lungs are clear. Minimal atelectasis at the left lung base. No pulmonary edema, effusion or pneumothorax. AC joint osteoarthritis bilaterally. No acute osseous abnormality. IMPRESSION: No active cardiopulmonary disease. Electronically Signed   By: Tollie Ethavid  Kwon M.D.   On: 11/20/2016 20:19   Ct Head Wo Contrast  Result Date: 11/20/2016 CLINICAL DATA:  Blurry vision, nausea and dizziness.  Headache. EXAM: CT HEAD WITHOUT CONTRAST TECHNIQUE: Contiguous axial images were obtained from the base of the skull through the vertex without intravenous contrast. COMPARISON:  03/28/2015 FINDINGS: BRAIN: The ventricles and sulci are normal. No intraparenchymal hemorrhage, mass effect nor midline shift. No acute large vascular territory infarcts. Grey-white matter distinction is maintained. The basal ganglia are unremarkable. No abnormal extra-axial fluid collections. Basal cisterns are not effaced and midline. The brainstem and cerebellar hemispheres are without acute abnormalities. VASCULAR: Unremarkable. SKULL/SOFT TISSUES: No skull fracture. No significant soft tissue swelling. ORBITS/SINUSES: The included ocular globes and orbital contents are normal.The mastoid air cells are clear. The included paranasal sinuses are well-aerated. OTHER: None. IMPRESSION: Stable appearance the brain.  No acute intracranial abnormality. Electronically Signed   By: Tollie Ethavid  Kwon M.D.   On: 11/20/2016 20:20    EKG: Independently reviewed. Sinus rhythm, low-voltage QRS, no significant change from prior.   Assessment/Plan  1. Chest pain, CAD  - Pt has hx of CAD, s/p CABG in 2014  - He presents with 2-3 days of intermittent pain with description of sxs atypical for ACS, more likely MSK related to recent cough  - Initial EKG not significantly changed from prior, initial troponin undetectable, CXR with no acute disease  - He took a full-dose ASA prior to arrival  - No pain in ED, no ischemic changes noted on telemetry  -  Plan to monitor on telemetry for ischemic changes, obtain serial troponin measurements, and repeat EKG; given concomitant neurologic sxs described below, will check echo as well   - No longer taking antihypertensives or statin; will check LFT's and resume Lipitor    2. Gait difficulty, blurred vision - Pt reports transient blurred vision, primarily involving left eye, and a gait instability for past 2-3 days  - Sxs had improved and resolved on day of admission  - Head CT is negative for acute intracranial abnormality and no definite focal deficit is appreciated on exam  - MRI brain and echo ordered    3. Hypokalemia  - Serum potassium 3.2 on admission  - Treated with 40 mEq oral potassium  - He is being monitored on telemetry - Repeat chem panel  in am    DVT prophylaxis: sq Lovenox  Code Status: Full  Family Communication: Wife updated at bedside Disposition Plan: Observe on telemetry Consults called: None Admission status: Observation    Briscoe Deutscher, MD [facility] Pager [phone]  If 7PM-7AM, please contact night-coverage [email or web address] Password [facility]  11/20/2016, 11:28 PM

## 2016-11-20 NOTE — ED Provider Notes (Signed)
[facility]Y DEPT Provider Note   CSN: [id number] Arrival date & time: 11/20/16  1940     History   Chief Complaint Chief Complaint  Patient presents with  . Chest Pain    HPI WANG GRANADA is a 46 y.o. male.  HPI Patient has a history of coronary artery disease status post bypass graft.  he has noticed intermittent chest pain lasting maybe a minute at a time over the last couple of days.  Things seems to bring the pain on in particular. He denies any shortness of breath or radiation of the pain. This pain is not similar to his prior episodes of angina.  Patient also has been having some sensation of dizziness and unsteadiness. He has some discomfort behind his left eye. He feels like his vision was off this morning although that has improved. He denies any trouble with his speech.  Denies any focal numbness or weakness. Past Medical History:  Diagnosis Date  . Coronary artery disease   . Diverticulosis   . Heart attack   . Hematoma    Subcapsule renal  . HTN (hypertension)   . Hyperlipidemia   . Nephrolithiasis   . PVC (premature ventricular contraction)     Patient Active Problem List   Diagnosis Date Noted  . Chest pain at rest 07/20/2015  . BPH (benign prostatic hyperplasia) 07/20/2015  . Tobacco use 07/20/2015  . HFrEF (heart failure with reduced ejection fraction) (HCC) 12/14/2014  . Abnormal CT of the abdomen 12/07/2013  . Esophageal reflux 12/07/2013  . Palpitations 10/21/2013  . CAD (coronary artery disease) of artery bypass graft 10/02/2012  . Hyperlipemia 10/02/2012  . Kidney stones 10/02/2012    Past Surgical History:  Procedure Laterality Date  . CARDIAC SURGERY    . CORONARY ARTERY BYPASS GRAFT     LIMA to the LAD, SVG to ramus intermediate, SVG to right coronary artery 08/2012  . HEMORRHOID SURGERY     x 2  . INGUINAL HERNIA REPAIR Bilateral   . Kidney stents    . LITHOTRIPSY     x 3  . Rotator cufff Right   . VASECTOMY         Home  Medications    Prior to Admission medications   Medication Sig Start Date End Date Taking? Authorizing Provider  aspirin EC 81 MG tablet Take 81 mg by mouth daily. 10/02/12   Rollene Rotunda, MD  atorvastatin (LIPITOR) 40 MG tablet Take 1 tablet (40 mg total) by mouth daily. 05/22/16   Lyn Records, MD  fluticasone (FLONASE) 50 MCG/ACT nasal spray Place 2 sprays into both nostrils every evening.    Historical Provider, MD  ibuprofen (ADVIL,MOTRIN) 400 MG tablet Take 1 tablet (400 mg total) by mouth 3 (three) times daily. For 5 days. Stay well-hydrated. 07/22/15   Standley Brooking, MD  loratadine (CLARITIN) 10 MG tablet Take 10 mg by mouth at bedtime.     Historical Provider, MD  pantoprazole (PROTONIX) 40 MG tablet TAKE 1 TABLET BY MOUTH DAILY 08/24/14   Louis Meckel, MD  tamsulosin (FLOMAX) 0.4 MG CAPS capsule Take 0.4 mg by mouth daily.    Historical Provider, MD    Family History Family History  Problem Relation Age of Onset  . Coronary artery disease Father 43  . Heart disease Father   . Heart attack Maternal Grandfather   . Prostate cancer Paternal Grandfather   . Ovarian cancer Paternal Grandmother     mets  . Hypertension  Mother   . Arthritis/Rheumatoid Mother   . Emphysema Mother   . COPD Mother   . Neuropathy Brother   . Liver disease Brother   . Healthy Brother     Social History Social History  Substance Use Topics  . Smoking status: Current Every Day Smoker    Packs/day: 0.25    Years: 25.00    Types: Cigarettes  . Smokeless tobacco: Never Used  . Alcohol use No     Allergies   Codeine; Demerol [meperidine]; Dilaudid [hydromorphone hcl]; and Morphine and related   Review of Systems Review of Systems  All other systems reviewed and are negative.    Physical Exam Updated Vital Signs BP 100/79 (BP Location: Left Arm)   Pulse 89   Resp 15   Ht 5\' 8"  (1.727 m)   Wt 90.7 kg   SpO2 93%   BMI 30.41 kg/m   Physical Exam  Constitutional: He is  oriented to person, place, and time. He appears well-developed and well-nourished. No distress.  HENT:  Head: Normocephalic and atraumatic.  Right Ear: External ear normal.  Left Ear: External ear normal.  Mouth/Throat: Oropharynx is clear and moist.  Eyes: Conjunctivae are normal. Right eye exhibits no discharge. Left eye exhibits no discharge. No scleral icterus.  Neck: Neck supple. No tracheal deviation present.  Cardiovascular: Normal rate, regular rhythm and intact distal pulses.   Pulmonary/Chest: Effort normal and breath sounds normal. No stridor. No respiratory distress. He has no wheezes. He has no rales.  Abdominal: Soft. Bowel sounds are normal. He exhibits no distension. There is no tenderness. There is no rebound and no guarding.  Musculoskeletal: He exhibits no edema or tenderness.  Neurological: He is alert and oriented to person, place, and time. He has normal strength. No cranial nerve deficit (No facial droop, extraocular movements intact, tongue midline ) or sensory deficit. He exhibits normal muscle tone. He displays no seizure activity. Coordination normal.  No pronator drift bilateral upper extrem, able to hold both legs off bed for 5 seconds, sensation intact in all extremities, no visual field cuts, no left or right sided neglect, normal finger-nose exam bilaterally, no nystagmus noted   Skin: Skin is warm and dry. No rash noted.  Psychiatric: He has a normal mood and affect.  Nursing note and vitals reviewed.    ED Treatments / Results  Labs (all labs ordered are listed, but only abnormal results are displayed) Labs Reviewed  BASIC METABOLIC PANEL - Abnormal; Notable for the following:       Result Value   Potassium 3.2 (*)    All other components within normal limits  CBC  I-STAT TROPOININ, ED    EKG  EKG Interpretation  Date/Time:  Tuesday November 20 2016 19:51:30 EDT Ventricular Rate:  95 PR Interval:    QRS Duration: 90 QT Interval:  340 QTC  Calculation: 428 R Axis:   89 Text Interpretation:  Sinus rhythm Low voltage, precordial leads Anteroseptal infarct, old No significant change since last tracing Confirmed by Nandi Tonnesen  MD-J, Darcelle Herrada ([id number]) on 11/20/2016 7:55:57 PM       Radiology Dg Chest 2 View  Result Date: 11/20/2016 CLINICAL DATA:  Chest pain and pressure x2 days. History of myocardial infarction and coronary artery disease. EXAM: CHEST  2 VIEW COMPARISON:  07/20/2015 FINDINGS: The heart size and mediastinal contours are within normal limits. Median sternotomy with post CABG change. Both lungs are clear. Minimal atelectasis at the left lung base. No pulmonary edema,  effusion or pneumothorax. AC joint osteoarthritis bilaterally. No acute osseous abnormality. IMPRESSION: No active cardiopulmonary disease. Electronically Signed   By: Tollie Eth M.D.   On: 11/20/2016 20:19   Ct Head Wo Contrast  Result Date: 11/20/2016 CLINICAL DATA:  Blurry vision, nausea and dizziness.  Headache. EXAM: CT HEAD WITHOUT CONTRAST TECHNIQUE: Contiguous axial images were obtained from the base of the skull through the vertex without intravenous contrast. COMPARISON:  03/28/2015 FINDINGS: BRAIN: The ventricles and sulci are normal. No intraparenchymal hemorrhage, mass effect nor midline shift. No acute large vascular territory infarcts. Grey-white matter distinction is maintained. The basal ganglia are unremarkable. No abnormal extra-axial fluid collections. Basal cisterns are not effaced and midline. The brainstem and cerebellar hemispheres are without acute abnormalities. VASCULAR: Unremarkable. SKULL/SOFT TISSUES: No skull fracture. No significant soft tissue swelling. ORBITS/SINUSES: The included ocular globes and orbital contents are normal.The mastoid air cells are clear. The included paranasal sinuses are well-aerated. OTHER: None. IMPRESSION: Stable appearance the brain.  No acute intracranial abnormality. Electronically Signed   By: Tollie Eth M.D.    On: 11/20/2016 20:20    Procedures Procedures (including critical care time)  Medications Ordered in ED Medications  0.9 %  sodium chloride infusion ( Intravenous New Bag/Given 11/20/16 2042)     Initial Impression / Assessment and Plan / ED Course  I have reviewed the triage vital signs and the nursing notes.  Pertinent labs & imaging results that were available during my care of the patient were reviewed by me and considered in my medical decision making (see chart for details).   Family is concerned because they do not have insurance right now to allow follow up.  They are concerned he may not be able to see the cardiologist.  Pt is also worried about the vision and balance issues he was having this morning.  ?TIA  Pt does have history of CAD with CABG at a young age.  I will consult with medical service to see about possible overnight observation and cardiology consultation, possible further evaluation with MRI/neurology consult  Final Clinical Impressions(s) / ED Diagnoses   Final diagnoses:  Chest pain, unspecified type  Coronary artery disease involving native heart with angina pectoris, unspecified vessel or lesion type [facility])  Dizziness      Linwood Dibbles, MD 11/20/16 2158

## 2016-11-21 ENCOUNTER — Observation Stay (HOSPITAL_COMMUNITY): Payer: Self-pay

## 2016-11-21 ENCOUNTER — Encounter (HOSPITAL_COMMUNITY): Payer: Self-pay | Admitting: *Deleted

## 2016-11-21 ENCOUNTER — Observation Stay (HOSPITAL_BASED_OUTPATIENT_CLINIC_OR_DEPARTMENT_OTHER): Payer: Medicaid Other

## 2016-11-21 DIAGNOSIS — G459 Transient cerebral ischemic attack, unspecified: Secondary | ICD-10-CM

## 2016-11-21 DIAGNOSIS — R0789 Other chest pain: Secondary | ICD-10-CM

## 2016-11-21 DIAGNOSIS — R42 Dizziness and giddiness: Secondary | ICD-10-CM

## 2016-11-21 LAB — TROPONIN I
Troponin I: <0.03 ng/mL (ref <0.03)
Troponin I: <0.03 ng/mL (ref <0.03)
Troponin I: <0.03 ng/mL (ref <0.03)

## 2016-11-21 LAB — ECHOCARDIOGRAM COMPLETE
Height: 68 in
Weight: 3121.6 oz

## 2016-11-21 LAB — HEPATIC FUNCTION PANEL
ALT: 35 U/L (ref 17–63)
AST: 31 U/L (ref 15–41)
Albumin: 3.3 g/dL — ABNORMAL LOW (ref 3.5–5.0)
Alkaline Phosphatase: 121 U/L (ref 38–126)
BILIRUBIN DIRECT: 0.1 mg/dL (ref 0.1–0.5)
BILIRUBIN INDIRECT: 0.4 mg/dL (ref 0.3–0.9)
BILIRUBIN TOTAL: 0.5 mg/dL (ref 0.3–1.2)
Total Protein: 6.3 g/dL — ABNORMAL LOW (ref 6.5–8.1)

## 2016-11-21 LAB — SEDIMENTATION RATE: Sed Rate: 15 mm/hr (ref 0–16)

## 2016-11-21 LAB — LIPID PANEL
CHOLESTEROL: 157 mg/dL (ref 0–200)
HDL: 26 mg/dL — AB (ref >40)
LDL CALC: 115 mg/dL — AB (ref 0–99)
TRIGLYCERIDES: 78 mg/dL (ref <150)
Total CHOL/HDL Ratio: 6 RATIO
VLDL: 16 mg/dL (ref 0–40)

## 2016-11-21 LAB — TSH: TSH: 4.159 u[IU]/mL (ref 0.350–4.500)

## 2016-11-21 LAB — BASIC METABOLIC PANEL
Anion gap: 5 (ref 5–15)
BUN: 8 mg/dL (ref 6–20)
CALCIUM: 8.4 mg/dL — AB (ref 8.9–10.3)
CO2: 24 mmol/L (ref 22–32)
Chloride: 110 mmol/L (ref 101–111)
Creatinine, Ser: 1.05 mg/dL (ref 0.61–1.24)
GFR calc Af Amer: >60 mL/min (ref >60)
GFR calc non Af Amer: >60 mL/min (ref >60)
GLUCOSE: 102 mg/dL — AB (ref 65–99)
Potassium: 4 mmol/L (ref 3.5–5.1)
Sodium: 139 mmol/L (ref 135–145)

## 2016-11-21 MED ORDER — GUAIFENESIN 100 MG/5ML PO SOLN
5.0000 mL | ORAL | Status: DC | PRN
  Administered 2016-11-21 – 2016-11-22 (×4): 100 mg via ORAL
  Filled 2016-11-21: qty 5
  Filled 2016-11-21: qty 50
  Filled 2016-11-21 (×2): qty 5

## 2016-11-21 NOTE — Consult Note (Signed)
[place] A. Merlene Laughter, MD     [email or web address]          Juan Wade is an 46 y.o. male.   ASSESSMENT/PLAN: 1. Recurrent episode of blurred vision and gait ataxia/dizziness of unclear etiology. This may represent a TIA given his risk factors however. I agree with the increase in the patient aspirin to 325 mg. 2. Acute left hemicranial headache: Additional labs be obtained including sedimentation rate, C-reactive protein, thyroid function tests and vitamin B12 level. 3. Acute and chronic chest pain 4. Fatigue - I suggest outpatient polysomnography to evaluate for obstructive sleep apnea syndrome.    Patient is a 46 year old white male who presents with relatively acute onset of 2 episodes of blurred vision involving both eyes were mostly in the left side. This has been associated with disequilibrium and ataxia. The initial event lasted for 1 hour. A second event lasted for approximately 3 hours. He has had episodes of severe left hemicranial headache. The patient reports not being a headache person at baseline. The family reports that he has not been feeling right. He apparently has been cold pale and fatigue especially during these 2 events. It appears that these events were associated with worsening sternal chest pain. Patient does have a history of chronic chest pain at the site of his CABG but this apparently got worse recently. The report that he appears to be hot and sweaty at time. They complain that he has been more fatigue and has been snoring while sleeping. No shortness of breath is reported. No dysarthria, dysphagia, focal numbness or weakness. The review systems otherwise negative.   GENERAL: Pleasant male in no acute distress.  HEENT: Neck is supple head is normocephalic atraumatic. No scalp tenderness appreciated.  ABDOMEN: soft  EXTREMITIES: No edema   BACK: Normal  SKIN: Normal by inspection.    MENTAL STATUS: Alert and oriented. Speech,  language and cognition are generally intact. Judgment and insight normal.   CRANIAL NERVES: Pupils are equal, round and reactive to light and accomodation; extra ocular movements are full, there is no significant nystagmus; visual fields are full; upper and lower facial muscles are normal in strength and symmetric, there is no flattening of the nasolabial folds; tongue is midline; uvula is midline; shoulder elevation is normal.  MOTOR: Normal tone, bulk and strength; no pronator drift.  COORDINATION: Left finger to nose is normal, right finger to nose is normal, No rest tremor; no intention tremor; no postural tremor; no bradykinesia.  REFLEXES: Deep tendon reflexes are symmetrical and normal. Babinski reflexes are flexor bilaterally.   SENSATION: Normal to light touch, temperature, and pinprick.  GAIT: Normal.      Blood pressure 124/74, pulse 62, temperature 98.1 F (36.7 C), temperature source Oral, resp. rate 18, height 5' 8" (1.727 m), weight 195 lb 1.6 oz (88.5 kg), SpO2 98 %.  Past Medical History:  Diagnosis Date  . Coronary artery disease   . Diverticulosis   . Heart attack   . Hematoma    Subcapsule renal  . HTN (hypertension)   . Hyperlipidemia   . Nephrolithiasis   . PVC (premature ventricular contraction)     Past Surgical History:  Procedure Laterality Date  . CARDIAC SURGERY    . CORONARY ARTERY BYPASS GRAFT     LIMA to the LAD, SVG to ramus intermediate, SVG to right coronary artery 08/2012  . HEMORRHOID SURGERY     x 2  . INGUINAL HERNIA REPAIR Bilateral   .  Kidney stents    . LITHOTRIPSY     x 3  . Rotator cufff Right   . VASECTOMY      Family History  Problem Relation Age of Onset  . Coronary artery disease Father 38  . Heart disease Father   . Heart attack Maternal Grandfather   . Prostate cancer Paternal Grandfather   . Ovarian cancer Paternal Grandmother     mets  . Hypertension Mother   . Arthritis/Rheumatoid Mother   . Emphysema Mother    . COPD Mother   . Neuropathy Brother   . Liver disease Brother   . Healthy Brother     Social History:  reports that he has been smoking Cigarettes.  He has a 6.25 pack-year smoking history. He has never used smokeless tobacco. He reports that he does not drink alcohol or use drugs.  Allergies:  Allergies  Allergen Reactions  . Codeine Nausea And Vomiting  . Demerol [Meperidine] Other (See Comments)    Becomes angry   . Dilaudid [Hydromorphone Hcl] Nausea And Vomiting    IV  . Morphine And Related Nausea And Vomiting    Medications: Prior to Admission medications   Medication Sig Start Date End Date Taking? Authorizing Provider  aspirin EC 81 MG tablet Take 81 mg by mouth daily. 10/02/12  Yes Minus Breeding, MD  fluticasone (FLONASE) 50 MCG/ACT nasal spray Place 2 sprays into both nostrils every evening.   Yes Historical Provider, MD  ibuprofen (ADVIL,MOTRIN) 400 MG tablet Take 1 tablet (400 mg total) by mouth 3 (three) times daily. For 5 days. Stay well-hydrated. 07/22/15  Yes Samuella Cota, MD  loratadine (CLARITIN) 10 MG tablet Take 10 mg by mouth at bedtime.    Yes Historical Provider, MD    Scheduled Meds: . aspirin EC  81 mg Oral Daily  . atorvastatin  40 mg Oral q1800  . enoxaparin (LOVENOX) injection  40 mg Subcutaneous Q24H  . fluticasone  2 spray Each Nare QPM  . loratadine  10 mg Oral QHS   Continuous Infusions: PRN Meds:.acetaminophen **OR** acetaminophen (TYLENOL) oral liquid 160 mg/5 mL **OR** acetaminophen, guaiFENesin, ibuprofen, labetalol, nitroGLYCERIN, senna-docusate     Results for orders placed or performed during the hospital encounter of 11/20/16 (from the past 48 hour(s))  Basic metabolic panel     Status: Abnormal   Collection Time: 11/20/16  8:24 PM  Result Value Ref Range   Sodium 138 135 - 145 mmol/L   Potassium 3.2 (L) 3.5 - 5.1 mmol/L   Chloride 107 101 - 111 mmol/L   CO2 22 22 - 32 mmol/L   Glucose, Bld 86 65 - 99 mg/dL   BUN 7 6 -  20 mg/dL   Creatinine, Ser 1.18 0.61 - 1.24 mg/dL   Calcium 8.9 8.9 - 10.3 mg/dL   GFR calc non Af Amer >60 >60 mL/min   GFR calc Af Amer >60 >60 mL/min    Comment: (NOTE) The eGFR has been calculated using the CKD EPI equation. This calculation has not been validated in all clinical situations. eGFR's persistently <60 mL/min signify possible Chronic Kidney Disease.    Anion gap 9 5 - 15  CBC     Status: None   Collection Time: 11/20/16  8:24 PM  Result Value Ref Range   WBC 4.6 4.0 - 10.5 K/uL   RBC 4.94 4.22 - 5.81 MIL/uL   Hemoglobin 15.7 13.0 - 17.0 g/dL   HCT 45.1 39.0 - 52.0 %  MCV 91.3 78.0 - 100.0 fL   MCH 31.8 26.0 - 34.0 pg   MCHC 34.8 30.0 - 36.0 g/dL   RDW 12.8 11.5 - 15.5 %   Platelets 189 150 - 400 K/uL  I-stat troponin, ED     Status: None   Collection Time: 11/20/16  8:48 PM  Result Value Ref Range   Troponin i, poc 0.00 0.00 - 0.08 ng/mL   Comment 3            Comment: Due to the release kinetics of cTnI, a negative result within the first hours of the onset of symptoms does not rule out myocardial infarction with certainty. If myocardial infarction is still suspected, repeat the test at appropriate intervals.   Hepatic function panel     Status: Abnormal   Collection Time: 11/20/16 11:29 PM  Result Value Ref Range   Total Protein 6.3 (L) 6.5 - 8.1 g/dL   Albumin 3.3 (L) 3.5 - 5.0 g/dL   AST 31 15 - 41 U/L   ALT 35 17 - 63 U/L   Alkaline Phosphatase 121 38 - 126 U/L   Total Bilirubin 0.5 0.3 - 1.2 mg/dL   Bilirubin, Direct 0.1 0.1 - 0.5 mg/dL   Indirect Bilirubin 0.4 0.3 - 0.9 mg/dL  Troponin I (q 6hr x 3)     Status: None   Collection Time: 11/20/16 11:29 PM  Result Value Ref Range   Troponin I <0.03 <0.03 ng/mL  Lipid panel     Status: Abnormal   Collection Time: 11/21/16  4:56 AM  Result Value Ref Range   Cholesterol 157 0 - 200 mg/dL   Triglycerides 78 <150 mg/dL   HDL 26 (L) >40 mg/dL   Total CHOL/HDL Ratio 6.0 RATIO   VLDL 16 0 - 40  mg/dL   LDL Cholesterol 115 (H) 0 - 99 mg/dL    Comment:        Total Cholesterol/HDL:CHD Risk Coronary Heart Disease Risk Table                     Men   Women  1/2 Average Risk   3.4   3.3  Average Risk       5.0   4.4  2 X Average Risk   9.6   7.1  3 X Average Risk  23.4   11.0        Use the calculated Patient Ratio above and the CHD Risk Table to determine the patient's CHD Risk.        ATP III CLASSIFICATION (LDL):  <100     mg/dL   Optimal  100-129  mg/dL   Near or Above                    Optimal  130-159  mg/dL   Borderline  160-189  mg/dL   High  >190     mg/dL   Very High   Troponin I (q 6hr x 3)     Status: None   Collection Time: 11/21/16  4:56 AM  Result Value Ref Range   Troponin I <0.03 <0.03 ng/mL  Basic metabolic panel     Status: Abnormal   Collection Time: 11/21/16  4:56 AM  Result Value Ref Range   Sodium 139 135 - 145 mmol/L   Potassium 4.0 3.5 - 5.1 mmol/L    Comment: DELTA CHECK NOTED   Chloride 110 101 - 111 mmol/L   CO2 24 22 -  32 mmol/L   Glucose, Bld 102 (H) 65 - 99 mg/dL   BUN 8 6 - 20 mg/dL   Creatinine, Ser 1.05 0.61 - 1.24 mg/dL   Calcium 8.4 (L) 8.9 - 10.3 mg/dL   GFR calc non Af Amer >60 >60 mL/min   GFR calc Af Amer >60 >60 mL/min    Comment: (NOTE) The eGFR has been calculated using the CKD EPI equation. This calculation has not been validated in all clinical situations. eGFR's persistently <60 mL/min signify possible Chronic Kidney Disease.    Anion gap 5 5 - 15  Troponin I (q 6hr x 3)     Status: None   Collection Time: 11/21/16 11:49 AM  Result Value Ref Range   Troponin I <0.03 <0.03 ng/mL    Studies/Results:  NECK MRA FINDINGS: Time-of-flight MRA imaging of the neck reveals antegrade flow in both carotid and vertebral arteries throughout the neck and to the skullbase. The right vertebral artery appears mildly dominant.  No evidence of stenosis at the right carotid bifurcation. Negative cervical right ICA.  No  evidence of stenosis at the left carotid bifurcation. Negative cervical left ICA.  Vertebral artery origins are not well identified, especially the left. No vertebral artery abnormality identified in the neck.  IMPRESSION: Negative noncontrast neck MRA.       BRAIN MRA FINDINGS: Antegrade flow in the posterior circulation. Mildly dominant distal right vertebral artery, and the distal left vertebral artery functionally terminates in PICA. Right PICA origin is patent. Patent vertebrobasilar junction. Patent basilar artery without stenosis. Normal SCA origins. Fetal type bilateral PCA origins, especially the right. Bilateral PCA branches appear normal.  Antegrade flow in both ICA siphons. Mild cavernous ICA tortuosity. No siphon stenosis. Normal ophthalmic and posterior communicating artery origins. Normal carotid termini, MCA and ACA origins. Anterior communicating artery and the visible ACA branches appear normal with a median artery of the corpus callosum (normal variant). Early left MCA branching. Left MCA bifurcation and visible branches are within normal limits. Right MCA M1 segment, bifurcation, and visible right MCA branches are within normal limits.  IMPRESSION: Negative intracranial MRA.    BRAIN MRI FINDINGS: Brain: There is no evidence of acute infarct, intracranial hemorrhage, mass, midline shift, or extra-axial fluid collection. The ventricles and sulci are normal. The brain is normal in signal.  Vascular: Major intracranial vascular flow voids are preserved.  Skull and upper cervical spine: No suspicious marrow lesion.  Sinuses/Orbits: Unremarkable orbits.  No significant sinus disease.  Other: None.  IMPRESSION: Unremarkable brain MRI.      The brain MRI is reviewed in person. No acute lesions are seen on DWI. No white matter lesions are seen. No evidence of hemorrhage or old infarct.      Cici Rodriges A. Merlene Laughter, M.D.  Diplomate,  Tax adviser of Psychiatry and Neurology ( Neurology). 11/21/2016, 8:13 PM

## 2016-11-21 NOTE — Progress Notes (Signed)
*  PRELIMINARY RESULTS* Echocardiogram 2D Echocardiogram has been performed.  Jeryl Columbialliott, Lucila Klecka 11/21/2016, 10:45 AM

## 2016-11-21 NOTE — Progress Notes (Signed)
PROGRESS NOTE    Juan ServiceJames M Buczek  ZOX:[id number] DOB: 07-29-1971 DOA: 11/20/2016 PCP: Juliette AlcideBURDINE,STEVEN E, MD    Brief Narrative: Juan Wade is a 46 y.o. male with medical history significant for CAD status post CABG in 2014, and allergic rhinitis who presents to the emergency department with 2-3 days of intermittent chest pain, visual impairment, and gait instability. Currently he denies any chest pain. Reports some blurry vision today.   Assessment & Plan:   Principal Problem:   Chest pain at rest Active Problems:   CAD (coronary artery disease) of artery bypass graft   Hypokalemia   Gait difficulty   Vision disturbance   TIA (transient ischemic attack)   Atypical chest pain:  Currently no chest pain, worsening pain on coughing . Had an upper respiratory tract infection for the last 3 to 4 days.  CXR no disease.  troponins are negative.  EKG shows sinus rhythm and no ischemic changes.  Echocardiogram ordered and pending.   Gait instability and blurry vision:  Over the last few days. Currently no vision impairment. MRI brain ruled out acute stroke. MRA of the head and neck pending.  Further TIA work up is still pending.  Neurology consulted for recommendations.  Lipid panel and hgba1c ordered.  PT/OT [place]/SLP Eval ordered.  Resume aspirin.   Hypokalemia: repleted.        DVT prophylaxis: (Lovenox/ Code Status: full code.  Family Communication: partner at bedside.  Disposition Plan: pending further work up.    Consultants:   Neurology.    Procedures: MRA head and neck.    Antimicrobials: none.    Subjective: No chest pain, blurry vision resolved.   Objective: Vitals:   11/20/16 2230 11/20/16 2300 11/20/16 2330 11/21/16 0001  BP: 110/70 108/76 106/74 121/83  Pulse: 74 72 71 68  Resp: 17 18 17 16   Temp:    97.8 F (36.6 C)  TempSrc:    Oral  SpO2: 97% 96% 97% 97%  Weight:    88.5 kg (195 lb 1.6 oz)  Height:    5\' 8"  (1.727 m)   No intake or  output data in the 24 hours ending 11/21/16 1336 Filed Weights   11/20/16 1948 11/21/16 0001  Weight: 90.7 kg (200 lb) 88.5 kg (195 lb 1.6 oz)    Examination:  General exam: Appears calm and comfortable  Respiratory system: Clear to auscultation. Respiratory effort normal. Cardiovascular system: S1 & S2 heard, RRR. No JVD, murmurs, rubs, gallops or clicks. No pedal edema. Gastrointestinal system: Abdomen is nondistended, soft and nontender. No organomegaly or masses felt. Normal bowel sounds heard. Central nervous system: Alert and oriented. No focal neurological deficits. Extremities: Symmetric 5 x 5 power. Skin: No rashes, lesions or ulcers Psychiatry: Judgement and insight appear normal. Mood & affect appropriate.     Data Reviewed: I have personally reviewed following labs and imaging studies  CBC:  Recent Labs Lab 11/20/16 2024  WBC 4.6  HGB 15.7  HCT 45.1  MCV 91.3  PLT 189   Basic Metabolic Panel:  Recent Labs Lab 11/20/16 2024 11/21/16 0456  NA 138 139  K 3.2* 4.0  CL 107 110  CO2 22 24  GLUCOSE 86 102*  BUN 7 8  CREATININE 1.18 1.05  CALCIUM 8.9 8.4*   GFR: Estimated Creatinine Clearance: 94 mL/min (by C-G formula based on SCr of 1.05 mg/dL). Liver Function Tests:  Recent Labs Lab 11/20/16 2329  AST 31  ALT 35  ALKPHOS 121  BILITOT  0.5  PROT 6.3*  ALBUMIN 3.3*   No results for input(s): LIPASE, AMYLASE in the last 168 hours. No results for input(s): AMMONIA in the last 168 hours. Coagulation Profile: No results for input(s): INR, PROTIME in the last 168 hours. Cardiac Enzymes:  Recent Labs Lab 11/20/16 2329 11/21/16 0456 11/21/16 1149  TROPONINI <0.03 <0.03 <0.03   BNP (last 3 results) No results for input(s): PROBNP in the last 8760 hours. HbA1C: No results for input(s): HGBA1C in the last 72 hours. CBG: No results for input(s): GLUCAP in the last 168 hours. Lipid Profile:  Recent Labs  11/21/16 0456  CHOL 157  HDL 26*    LDLCALC 115*  TRIG 78  CHOLHDL 6.0   Thyroid Function Tests: No results for input(s): TSH, T4TOTAL, FREET4, T3FREE, THYROIDAB in the last 72 hours. Anemia Panel: No results for input(s): VITAMINB12, FOLATE, FERRITIN, TIBC, IRON, RETICCTPCT in the last 72 hours. Sepsis Labs: No results for input(s): PROCALCITON, LATICACIDVEN in the last 168 hours.  No results found for this or any previous visit (from the past 240 hour(s)).       Radiology Studies: Dg Chest 2 View  Result Date: 11/20/2016 CLINICAL DATA:  Chest pain and pressure x2 days. History of myocardial infarction and coronary artery disease. EXAM: CHEST  2 VIEW COMPARISON:  07/20/2015 FINDINGS: The heart size and mediastinal contours are within normal limits. Median sternotomy with post CABG change. Both lungs are clear. Minimal atelectasis at the left lung base. No pulmonary edema, effusion or pneumothorax. AC joint osteoarthritis bilaterally. No acute osseous abnormality. IMPRESSION: No active cardiopulmonary disease. Electronically Signed   By: Tollie Eth M.D.   On: 11/20/2016 20:19   Ct Head Wo Contrast  Result Date: 11/20/2016 CLINICAL DATA:  Blurry vision, nausea and dizziness.  Headache. EXAM: CT HEAD WITHOUT CONTRAST TECHNIQUE: Contiguous axial images were obtained from the base of the skull through the vertex without intravenous contrast. COMPARISON:  03/28/2015 FINDINGS: BRAIN: The ventricles and sulci are normal. No intraparenchymal hemorrhage, mass effect nor midline shift. No acute large vascular territory infarcts. Grey-white matter distinction is maintained. The basal ganglia are unremarkable. No abnormal extra-axial fluid collections. Basal cisterns are not effaced and midline. The brainstem and cerebellar hemispheres are without acute abnormalities. VASCULAR: Unremarkable. SKULL/SOFT TISSUES: No skull fracture. No significant soft tissue swelling. ORBITS/SINUSES: The included ocular globes and orbital contents are  normal.The mastoid air cells are clear. The included paranasal sinuses are well-aerated. OTHER: None. IMPRESSION: Stable appearance the brain.  No acute intracranial abnormality. Electronically Signed   By: Tollie Eth M.D.   On: 11/20/2016 20:20   Mr Juan Wade Head Wo Contrast  Result Date: 11/21/2016 CLINICAL DATA:  46 year old male with nausea blurred vision dizziness and headache. Initial encounter. EXAM: MRA HEAD WITHOUT CONTRAST TECHNIQUE: Angiographic images of the Circle of Willis were obtained using MRA technique without intravenous contrast. COMPARISON:  Brain MRI 0755 hours today. Neck MRA from today reported separately. FINDINGS: Antegrade flow in the posterior circulation. Mildly dominant distal right vertebral artery, and the distal left vertebral artery functionally terminates in PICA. Right PICA origin is patent. Patent vertebrobasilar junction. Patent basilar artery without stenosis. Normal SCA origins. Fetal type bilateral PCA origins, especially the right. Bilateral PCA branches appear normal. Antegrade flow in both ICA siphons. Mild cavernous ICA tortuosity. No siphon stenosis. Normal ophthalmic and posterior communicating artery origins. Normal carotid termini, MCA and ACA origins. Anterior communicating artery and the visible ACA branches appear normal with a median artery  of the corpus callosum (normal variant). Early left MCA branching. Left MCA bifurcation and visible branches are within normal limits. Right MCA M1 segment, bifurcation, and visible right MCA branches are within normal limits. IMPRESSION: Negative intracranial MRA. Electronically Signed   By: Odessa Fleming M.D.   On: 11/21/2016 11:53   Mr Juan Wade Neck Wo Contrast  Result Date: 11/21/2016 CLINICAL DATA:  46 year old male with nausea blurred vision dizziness and headache. Initial encounter. EXAM: MRA NECK WITHOUT CONTRAST TECHNIQUE: Angiographic images of the neck were obtained using MRA technique without intravenous contrast. Carotid  stenosis measurements (when applicable) are obtained utilizing NASCET criteria, using the distal internal carotid diameter as the denominator. COMPARISON:  Brain MRI and intracranial MRA today reported separately. FINDINGS: Time-of-flight MRA imaging of the neck reveals antegrade flow in both carotid and vertebral arteries throughout the neck and to the skullbase. The right vertebral artery appears mildly dominant. No evidence of stenosis at the right carotid bifurcation. Negative cervical right ICA. No evidence of stenosis at the left carotid bifurcation. Negative cervical left ICA. Vertebral artery origins are not well identified, especially the left. No vertebral artery abnormality identified in the neck. IMPRESSION: Negative noncontrast neck MRA. Electronically Signed   By: Odessa Fleming M.D.   On: 11/21/2016 11:51   Mr Brain Wo Contrast  Result Date: 11/21/2016 CLINICAL DATA:  Nausea, blurred vision, dizziness, and headache. EXAM: MRI HEAD WITHOUT CONTRAST TECHNIQUE: Multiplanar, multiecho pulse sequences of the brain and surrounding structures were obtained without intravenous contrast. COMPARISON:  11/20/2016 FINDINGS: Brain: There is no evidence of acute infarct, intracranial hemorrhage, mass, midline shift, or extra-axial fluid collection. The ventricles and sulci are normal. The brain is normal in signal. Vascular: Major intracranial vascular flow voids are preserved. Skull and upper cervical spine: No suspicious marrow lesion. Sinuses/Orbits: Unremarkable orbits.  No significant sinus disease. Other: None. IMPRESSION: Unremarkable brain MRI. Electronically Signed   By: Sebastian Ache M.D.   On: 11/21/2016 08:20        Scheduled Meds: . aspirin EC  81 mg Oral Daily  . atorvastatin  40 mg Oral q1800  . enoxaparin (LOVENOX) injection  40 mg Subcutaneous Q24H  . fluticasone  2 spray Each Nare QPM  . loratadine  10 mg Oral QHS   Continuous Infusions:   LOS: 0 days    Time spent: 30  min    Salvatore Poe, MD [facility] Pager [phone]   If 7PM-7AM, please contact night-coverage [email or web address] Password [facility] 11/21/2016, 1:36 PM

## 2016-11-21 NOTE — Progress Notes (Signed)
Pt stated he felt "swimmy headed". Rating his pain 8 out of 10. Advil given, VSS. Dr on call paged and made aware.

## 2016-11-21 NOTE — Evaluation (Signed)
Physical Therapy Evaluation Patient Details Name: Juan Wade MRN: [id number] DOB: May 11, 1971 Today's Date: 11/21/2016   History of Present Illness  46 y.o. male with medical history significant for CAD status post CABG in 2014, and allergic rhinitis who presents to the emergency department with 2-3 days of intermittent chest pain, visual impairment, and gait instability. Patient reports that he had been in his usual state until approximately 2-3 days ago when he noted the insidious development of a dull achy pain at the lower sternum, occurring at both rest and with activity, lasting approximately 1 minute before easing off spontaneously, nonradiating, and not associated with dyspnea, nausea, or diaphoresis. He describes the location of the pain is the same as a chronic pain that he has experienced intermittently since the time of this CABG, but notes that the character of the pain is also little bit different. He describes the pain as not similar to his prior experiences with angina. He also notes that over the past 2 days, he has had a nonproductive cough without dyspnea, and without fevers or chills. In addition to this, the patient also complains of a transient visual disturbance which occurred yesterday and again this morning, involved the left eye primarily, and resolved spontaneously prior to arrival in the hospital. He also notes feeling off balance with ambulation for the past 2 days, but denies any fall or trauma, and reports that this has also spontaneously resolved. Patient chewed a full dose of aspirin prior to coming in.  Head Ct (-), and MRI (-).      Clinical Impression  Pt received in bed, wife present, and pt is agreeable to PT evaluation.  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  He states that he only feels unsteady when he first gets up in the morning.  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  He is currently not having any CP, blurry vision.  He was able to quickly ambulate 47100ft without any assistance and no balance deficits noted.  He does not demonstrate need for skilled PT at this time, will sign off.  He is interested in cardiac rehab.     Follow Up Recommendations No PT follow up    Equipment Recommendations  None recommended by PT    Recommendations for Other Services       Precautions / Restrictions Precautions Precautions: None Restrictions Weight Bearing Restrictions: No      Mobility  Bed Mobility Overal bed mobility: Independent                Transfers Overall transfer level: Independent                  Ambulation/Gait Ambulation/Gait assistance: Independent Ambulation Distance (Feet): 400 Feet Assistive device: None Gait Pattern/deviations: WFL(Within Functional Limits)   Gait velocity interpretation: >2.62 ft/sec, indicative of independent community Location managerambulator    Stairs            Wheelchair Mobility    Modified Rankin (Stroke Patients Only)       Balance Overall balance assessment: Independent  Pertinent Vitals/Pain Pain Assessment: No/denies pain    Home Living   Living Arrangements: Spouse/significant other Available Help at Discharge: Available 24 hours/day Type of Home: Mobile home Home Access: Stairs to enter   Entrance Stairs-Number of Steps: 3-4 Home Layout: One level Home Equipment: Bedside commode;Walker - 2 wheels;Tub bench Additional Comments: He is the caregiver for his wife who recently recovered.      Prior Function Level of Independence: Independent   Gait / Transfers Assistance Needed: independent  ADL's / Homemaking Assistance Needed: independent        Hand Dominance   Dominant Hand: Right    Extremity/Trunk Assessment    Upper Extremity Assessment Upper Extremity Assessment: Overall WFL for tasks assessed    Lower Extremity Assessment Lower Extremity Assessment: Overall WFL for tasks assessed       Communication   Communication: No difficulties  Cognition Arousal/Alertness: Awake/alert Behavior During Therapy: WFL for tasks assessed/performed Overall Cognitive Status: Within Functional Limits for tasks assessed                                        General Comments      Exercises     Assessment/Plan    PT Assessment Patent does not need any further PT services  PT Problem List         PT Treatment Interventions      PT Goals (Current goals can be found in the Care Plan section)  Acute Rehab PT Goals Patient Stated Goal: To go home. PT Goal Formulation: All assessment and education complete, DC therapy    Frequency     Barriers to discharge        Co-evaluation               End of Session Equipment Utilized During Treatment: Gait belt Activity Tolerance: Patient tolerated treatment well Patient left:  (Sitting on the EOB. ) Nurse Communication: Mobility status (Tamika, RN aware of pt's mobility status. ) PT Visit Diagnosis: Other abnormalities of gait and mobility (R26.89)    Time: 1536-1550 PT Time Calculation (min) (ACUTE ONLY): 14 min   Charges:   PT Evaluation $PT Eval Low Complexity: 1 Procedure     PT G Codes:   PT G-Codes **NOT FOR INPATIENT CLASS** Functional Assessment Tool Used: AM-PAC 6 Clicks Basic Mobility;Clinical judgement Functional Limitation: Mobility: Walking and moving around Mobility: Walking and Moving Around Current Status (Z6109): 0 percent impaired, limited or restricted Mobility: Walking and Moving Around Goal Status (U0454): 0 percent impaired, limited or restricted Mobility: Walking and Moving Around Discharge Status (U9811): 0 percent impaired, limited or restricted    Beth Babara [place], PT, DPT X: [phone]

## 2016-11-21 NOTE — Progress Notes (Signed)
OT Screen Note  Patient Details Name: Juan Wade MRN: [id number] DOB: 1971/05/06   Cancelled Treatment:    Reason Eval/Treat Not Completed: OT screened, no needs identified, will sign off   Limmie PatriciaLaura Essenmacher, OTR/L,CBIS  [phone]  11/21/2016, 4:00 PM

## 2016-11-21 NOTE — Progress Notes (Signed)
SLP Cancellation Note  Patient Details Name: Juan Wade MRN: [id number] DOB: 11/21/1970   Cancelled treatment:        Reason Eval/Treat Not Completed: ST screened, no needs identified. Brain MRI reveals "Unremarkable brain MRI." ST to sign off.  Juan Wade H. Romie LeveeYarbrough MA, CCC-SLP Speech Language Pathologist    Juan Wade 11/21/2016, 4:14 PM

## 2016-11-22 DIAGNOSIS — I25119 Atherosclerotic heart disease of native coronary artery with unspecified angina pectoris: Secondary | ICD-10-CM

## 2016-11-22 LAB — HEMOGLOBIN A1C
Hgb A1c MFr Bld: 5.9 % — ABNORMAL HIGH (ref 4.8–5.6)
Mean Plasma Glucose: 123 mg/dL

## 2016-11-22 LAB — BASIC METABOLIC PANEL
Anion gap: 5 (ref 5–15)
BUN: 10 mg/dL (ref 6–20)
CALCIUM: 8.4 mg/dL — AB (ref 8.9–10.3)
CHLORIDE: 107 mmol/L (ref 101–111)
CO2: 26 mmol/L (ref 22–32)
CREATININE: 1.21 mg/dL (ref 0.61–1.24)
GFR calc non Af Amer: >60 mL/min (ref >60)
GLUCOSE: 107 mg/dL — AB (ref 65–99)
Potassium: 3.9 mmol/L (ref 3.5–5.1)
Sodium: 138 mmol/L (ref 135–145)

## 2016-11-22 LAB — VITAMIN B12: VITAMIN B 12: 342 pg/mL (ref 180–914)

## 2016-11-22 LAB — HIV ANTIBODY (ROUTINE TESTING W REFLEX): HIV SCREEN 4TH GENERATION: NONREACTIVE

## 2016-11-22 LAB — MAGNESIUM: Magnesium: 1.7 mg/dL (ref 1.7–2.4)

## 2016-11-22 LAB — C-REACTIVE PROTEIN: CRP: 1.3 mg/dL — ABNORMAL HIGH (ref <1.0)

## 2016-11-22 MED ORDER — PHENYLEPHRINE HCL 10 MG PO TABS
5.0000 mg | ORAL_TABLET | ORAL | Status: DC | PRN
  Filled 2016-11-22: qty 1

## 2016-11-22 MED ORDER — GUAIFENESIN-DM 100-10 MG/5ML PO SYRP
5.0000 mL | ORAL_SOLUTION | ORAL | Status: DC | PRN
  Administered 2016-11-22: 5 mL via ORAL
  Filled 2016-11-22: qty 5

## 2016-11-22 MED ORDER — METHYLPREDNISOLONE SODIUM SUCC 1000 MG IJ SOLR
INTRAMUSCULAR | Status: AC
  Filled 2016-11-22: qty 8

## 2016-11-22 MED ORDER — DEXTROSE 5 % IV SOLN
500.0000 mg | INTRAVENOUS | Status: DC
  Administered 2016-11-22: 500 mg via INTRAVENOUS
  Filled 2016-11-22 (×5): qty 500

## 2016-11-22 MED ORDER — ASPIRIN 325 MG PO TABS
325.0000 mg | ORAL_TABLET | Freq: Every day | ORAL | Status: DC
  Administered 2016-11-22 – 2016-11-23 (×2): 325 mg via ORAL
  Filled 2016-11-22 (×2): qty 1

## 2016-11-22 MED ORDER — HYDROMORPHONE HCL 2 MG PO TABS
1.0000 mg | ORAL_TABLET | Freq: Four times a day (QID) | ORAL | Status: DC | PRN
  Administered 2016-11-22: 1 mg via ORAL
  Filled 2016-11-22: qty 1

## 2016-11-22 MED ORDER — SODIUM CHLORIDE 0.9 % IV SOLN
500.0000 mg | INTRAVENOUS | Status: DC
  Administered 2016-11-22: 500 mg via INTRAVENOUS
  Filled 2016-11-22 (×4): qty 4

## 2016-11-22 MED ORDER — ONDANSETRON HCL 4 MG/2ML IJ SOLN
4.0000 mg | Freq: Four times a day (QID) | INTRAMUSCULAR | Status: DC | PRN
  Administered 2016-11-22: 4 mg via INTRAVENOUS
  Filled 2016-11-22: qty 2

## 2016-11-22 MED ORDER — PSEUDOEPHEDRINE HCL 60 MG PO TABS
60.0000 mg | ORAL_TABLET | Freq: Four times a day (QID) | ORAL | Status: DC | PRN
  Administered 2016-11-22: 60 mg via ORAL
  Filled 2016-11-22: qty 1

## 2016-11-22 MED ORDER — MAGNESIUM OXIDE 400 (241.3 MG) MG PO TABS
400.0000 mg | ORAL_TABLET | Freq: Two times a day (BID) | ORAL | Status: DC
  Administered 2016-11-22 – 2016-11-23 (×2): 400 mg via ORAL
  Filled 2016-11-22 (×2): qty 1

## 2016-11-22 NOTE — Progress Notes (Signed)
Pt had 6 beat run of Vtach. Dr. Conley RollsLe paged and made aware

## 2016-11-22 NOTE — Progress Notes (Signed)
Dr. Conley RollsLe ordered Sudafed 5mg  for pt after c/o headache 8/10 and requesting a decongestant. All pharmacy had was 60mg  and 120mg  extended release. Tommas Olpe. Le made aware and decided that he was not going to order anything for pt d/t neuro diagnosis. Adv pt that d/t his diagnosis of possible stroke Dr. Conley RollsLe was going to hold off for now on ordering any decongestant or any other medication. Adv pt that he can talk to his doctor later today when he comes in. Pt verbalized understanding.

## 2016-11-22 NOTE — Progress Notes (Signed)
Notified MD several timnes today in regards to medications and symptoms today.  Symptoms included HA, rib pain, nausea, cough, blurred vision and etc.  MD addressed the issues. Will continue to follow.

## 2016-11-22 NOTE — Progress Notes (Signed)
PROGRESS NOTE    Juan Wade  PVX:[id number] DOB: 1971/06/29 DOA: 11/20/2016 PCP: Curlene Labrum, MD    Brief Narrative: Juan Wade is a 46 y.o. male with medical history significant for CAD status post CABG in 2014, and allergic rhinitis who presents to the emergency department with 2-3 days of intermittent chest pain, visual impairment, and gait instability. Currently he denies any chest pain. Reports some blurry vision on and off.  Overnight he had 6 beat of NS VT, his electrolytes wnl. He remains asymptomatic.   Assessment & Plan:   Principal Problem:   Chest pain at rest Active Problems:   CAD (coronary artery disease) of artery bypass graft   Hypokalemia   Gait difficulty   Vision disturbance   TIA (transient ischemic attack)   Atypical chest pain: pt has a h/o CAD with CABG:   Currently no chest pain, worsens pain on coughing . Had an upper respiratory tract infection for the last 3 to 4 days.  CXR no disease.  troponins are negative.  EKG shows sinus rhythm and no ischemic changes.  Echocardiogram ordered and unremarkable.   Gait instability and blurry vision:  Over the last few days. Currently no vision impairment. MRI brain ruled out acute stroke. MRA of the head and neck ,are negative.  Neurology consulted for recommendations given.  Lipid panel and hgba1c ordered. hgba1c is 5.9 and lipid panel shows HDL Of 26 and LDL of 115. He has been off statins for more than a year as he has issues with insurance.  PT/OT [facility] Eval ordered. No further follow up  Resume aspirin 325 mg daily.    Hypokalemia: repleted.   Left hemi cranial headache: ESR normal, CRP, and vit b12 pending. TSH wnl. HIV negative.  REPORTS headache doesn't improve with tylenol , oxy or norco. Requesting dilaudid.       DVT prophylaxis: (Lovenox/ Code Status: full code.  Family Communication: partner at bedside.  Disposition Plan: pending further work up.    Consultants:    Neurology.    Procedures: MRA head and neck.    Antimicrobials: none.    Subjective: No chest pain, blurry vision resolved.   Objective: Vitals:   11/21/16 0001 11/21/16 1318 11/21/16 2205 11/22/16 0627  BP: 121/83 124/74 124/78 128/68  Pulse: 68 62 76 72  Resp: 16 18 18 20   Temp: 97.8 F (36.6 C) 98.1 F (36.7 C) 98.7 F (37.1 C) 98.2 F (36.8 C)  TempSrc: Oral Oral Oral Oral  SpO2: 97% 98% 97% 98%  Weight: 88.5 kg (195 lb 1.6 oz)     Height: 5' 8"  (1.727 m)       Intake/Output Summary (Last 24 hours) at 11/22/16 1323 Last data filed at 11/22/16 1227  Gross per 24 hour  Intake              680 ml  Output                0 ml  Net              680 ml   Filed Weights   11/20/16 1948 11/21/16 0001  Weight: 90.7 kg (200 lb) 88.5 kg (195 lb 1.6 oz)    Examination:  General exam: Appears calm and comfortable  Respiratory system: Clear to auscultation. Respiratory effort normal. Cardiovascular system: S1 & S2 heard, RRR. No JVD, murmurs, rubs, gallops or clicks. No pedal edema. Gastrointestinal system: Abdomen is nondistended, soft and nontender. No organomegaly or  masses felt. Normal bowel sounds heard. Central nervous system: Alert and oriented. No focal neurological deficits. Extremities: Symmetric 5 x 5 power. Skin: No rashes, lesions or ulcers Psychiatry: Judgement and insight appear normal. Mood & affect appropriate.     Data Reviewed: I have personally reviewed following labs and imaging studies  CBC:  Recent Labs Lab 11/20/16 2024  WBC 4.6  HGB 15.7  HCT 45.1  MCV 91.3  PLT 782   Basic Metabolic Panel:  Recent Labs Lab 11/20/16 2024 11/21/16 0456 11/22/16 1123  NA 138 139 138  K 3.2* 4.0 3.9  CL 107 110 107  CO2 22 24 26   GLUCOSE 86 102* 107*  BUN 7 8 10   CREATININE 1.18 1.05 1.21  CALCIUM 8.9 8.4* 8.4*  MG  --   --  1.7   GFR: Estimated Creatinine Clearance: 81.6 mL/min (by C-G formula based on SCr of 1.21 mg/dL). Liver  Function Tests:  Recent Labs Lab 11/20/16 2329  AST 31  ALT 35  ALKPHOS 121  BILITOT 0.5  PROT 6.3*  ALBUMIN 3.3*   No results for input(s): LIPASE, AMYLASE in the last 168 hours. No results for input(s): AMMONIA in the last 168 hours. Coagulation Profile: No results for input(s): INR, PROTIME in the last 168 hours. Cardiac Enzymes:  Recent Labs Lab 11/20/16 2329 11/21/16 0456 11/21/16 1149  TROPONINI <0.03 <0.03 <0.03   BNP (last 3 results) No results for input(s): PROBNP in the last 8760 hours. HbA1C:  Recent Labs  11/21/16 0456  HGBA1C 5.9*   CBG: No results for input(s): GLUCAP in the last 168 hours. Lipid Profile:  Recent Labs  11/21/16 0456  CHOL 157  HDL 26*  LDLCALC 115*  TRIG 78  CHOLHDL 6.0   Thyroid Function Tests:  Recent Labs  11/21/16 2106  TSH 4.159   Anemia Panel: No results for input(s): VITAMINB12, FOLATE, FERRITIN, TIBC, IRON, RETICCTPCT in the last 72 hours. Sepsis Labs: No results for input(s): PROCALCITON, LATICACIDVEN in the last 168 hours.  No results found for this or any previous visit (from the past 240 hour(s)).       Radiology Studies: Dg Chest 2 View  Result Date: 11/20/2016 CLINICAL DATA:  Chest pain and pressure x2 days. History of myocardial infarction and coronary artery disease. EXAM: CHEST  2 VIEW COMPARISON:  07/20/2015 FINDINGS: The heart size and mediastinal contours are within normal limits. Median sternotomy with post CABG change. Both lungs are clear. Minimal atelectasis at the left lung base. No pulmonary edema, effusion or pneumothorax. AC joint osteoarthritis bilaterally. No acute osseous abnormality. IMPRESSION: No active cardiopulmonary disease. Electronically Signed   By: Ashley Royalty M.D.   On: 11/20/2016 20:19   Ct Head Wo Contrast  Result Date: 11/20/2016 CLINICAL DATA:  Blurry vision, nausea and dizziness.  Headache. EXAM: CT HEAD WITHOUT CONTRAST TECHNIQUE: Contiguous axial images were obtained  from the base of the skull through the vertex without intravenous contrast. COMPARISON:  03/28/2015 FINDINGS: BRAIN: The ventricles and sulci are normal. No intraparenchymal hemorrhage, mass effect nor midline shift. No acute large vascular territory infarcts. Grey-white matter distinction is maintained. The basal ganglia are unremarkable. No abnormal extra-axial fluid collections. Basal cisterns are not effaced and midline. The brainstem and cerebellar hemispheres are without acute abnormalities. VASCULAR: Unremarkable. SKULL/SOFT TISSUES: No skull fracture. No significant soft tissue swelling. ORBITS/SINUSES: The included ocular globes and orbital contents are normal.The mastoid air cells are clear. The included paranasal sinuses are well-aerated. OTHER: None. IMPRESSION: Stable  appearance the brain.  No acute intracranial abnormality. Electronically Signed   By: Ashley Royalty M.D.   On: 11/20/2016 20:20   Mr Jodene Nam Head Wo Contrast  Result Date: 11/21/2016 CLINICAL DATA:  46 year old male with nausea blurred vision dizziness and headache. Initial encounter. EXAM: MRA HEAD WITHOUT CONTRAST TECHNIQUE: Angiographic images of the Circle of Willis were obtained using MRA technique without intravenous contrast. COMPARISON:  Brain MRI 0755 hours today. Neck MRA from today reported separately. FINDINGS: Antegrade flow in the posterior circulation. Mildly dominant distal right vertebral artery, and the distal left vertebral artery functionally terminates in PICA. Right PICA origin is patent. Patent vertebrobasilar junction. Patent basilar artery without stenosis. Normal SCA origins. Fetal type bilateral PCA origins, especially the right. Bilateral PCA branches appear normal. Antegrade flow in both ICA siphons. Mild cavernous ICA tortuosity. No siphon stenosis. Normal ophthalmic and posterior communicating artery origins. Normal carotid termini, MCA and ACA origins. Anterior communicating artery and the visible ACA branches  appear normal with a median artery of the corpus callosum (normal variant). Early left MCA branching. Left MCA bifurcation and visible branches are within normal limits. Right MCA M1 segment, bifurcation, and visible right MCA branches are within normal limits. IMPRESSION: Negative intracranial MRA. Electronically Signed   By: Genevie Ann M.D.   On: 11/21/2016 11:53   Mr Jodene Nam Neck Wo Contrast  Result Date: 11/21/2016 CLINICAL DATA:  46 year old male with nausea blurred vision dizziness and headache. Initial encounter. EXAM: MRA NECK WITHOUT CONTRAST TECHNIQUE: Angiographic images of the neck were obtained using MRA technique without intravenous contrast. Carotid stenosis measurements (when applicable) are obtained utilizing NASCET criteria, using the distal internal carotid diameter as the denominator. COMPARISON:  Brain MRI and intracranial MRA today reported separately. FINDINGS: Time-of-flight MRA imaging of the neck reveals antegrade flow in both carotid and vertebral arteries throughout the neck and to the skullbase. The right vertebral artery appears mildly dominant. No evidence of stenosis at the right carotid bifurcation. Negative cervical right ICA. No evidence of stenosis at the left carotid bifurcation. Negative cervical left ICA. Vertebral artery origins are not well identified, especially the left. No vertebral artery abnormality identified in the neck. IMPRESSION: Negative noncontrast neck MRA. Electronically Signed   By: Genevie Ann M.D.   On: 11/21/2016 11:51   Mr Brain Wo Contrast  Result Date: 11/21/2016 CLINICAL DATA:  Nausea, blurred vision, dizziness, and headache. EXAM: MRI HEAD WITHOUT CONTRAST TECHNIQUE: Multiplanar, multiecho pulse sequences of the brain and surrounding structures were obtained without intravenous contrast. COMPARISON:  11/20/2016 FINDINGS: Brain: There is no evidence of acute infarct, intracranial hemorrhage, mass, midline shift, or extra-axial fluid collection. The  ventricles and sulci are normal. The brain is normal in signal. Vascular: Major intracranial vascular flow voids are preserved. Skull and upper cervical spine: No suspicious marrow lesion. Sinuses/Orbits: Unremarkable orbits.  No significant sinus disease. Other: None. IMPRESSION: Unremarkable brain MRI. Electronically Signed   By: Logan Bores M.D.   On: 11/21/2016 08:20        Scheduled Meds: . aspirin  325 mg Oral Daily  . atorvastatin  40 mg Oral q1800  . azithromycin  500 mg Intravenous Q24H  . enoxaparin (LOVENOX) injection  40 mg Subcutaneous Q24H  . fluticasone  2 spray Each Nare QPM  . loratadine  10 mg Oral QHS   Continuous Infusions:   LOS: 1 day    Time spent: 30 min    Marlet Korte, MD [facility] Pager [phone]   If 7PM-7AM,  please contact night-coverage [email or web address] Password [email or web address] 11/22/2016, 1:23 PM

## 2016-11-22 NOTE — Progress Notes (Signed)
[place] A. Merlene Laughter, MD     [email or web address]          Juan Wade is an 46 y.o. male.   ASSESSMENT/PLAN: 1. Recurrent episode of blurred vision and gait ataxia/dizziness of unclear etiology. This may represent a TIA given his risk factors however. I agree with the increase in the patient aspirin to 325 mg. 2. Acute unilateral hemicranial headache: - He seemed to have some sinus symptoms suggestive of acute sinusitis. I will try patient on bolus of steroids to help with symptoms. 3. Acute and chronic chest pain 4. Fatigue - I suggest outpatient polysomnography to evaluate for obstructive sleep apnea syndrome.     He reports having severe headaches on the right side now particularly retro-orbital he. The headaches are associated with photophobia and phonophobia. He has significant nasal symptoms. Congestion and drainage with these headaches. He now tells me that he has a history of frequent migraine headaches when he was younger but he outgrew them. No dizziness or focal neurological symptoms reported today.    GENERAL: Pleasant male in no acute distress.  HEENT: Neck is supple head is normocephalic atraumatic. No scalp tenderness appreciated.  ABDOMEN: soft  EXTREMITIES: No edema   BACK: Normal  SKIN: Normal by inspection.    MENTAL STATUS: Alert and oriented. Speech, language and cognition are generally intact. Judgment and insight normal.   CRANIAL NERVES: Pupils are equal, round and reactive to light and accomodation; extra ocular movements are full, there is no significant nystagmus; visual fields are full; upper and lower facial muscles are normal in strength and symmetric, there is no flattening of the nasolabial folds; tongue is midline; uvula is midline; shoulder elevation is normal.  MOTOR: Normal tone, bulk and strength; no pronator drift.  COORDINATION: Left finger to nose is normal, right finger to nose is normal, No rest tremor; no  intention tremor; no postural tremor; no bradykinesia.  REFLEXES: Deep tendon reflexes are symmetrical and normal. Babinski reflexes are flexor bilaterally.   SENSATION: Normal to light touch, temperature, and pinprick.  GAIT: Normal.    Echo looks good with ejection fraction of 55%.  Blood pressure 122/88, pulse 89, temperature 98.2 F (36.8 C), temperature source Oral, resp. rate 18, height 5' 8"  (1.727 m), weight 195 lb 1.6 oz (88.5 kg), SpO2 98 %.  Past Medical History:  Diagnosis Date  . Coronary artery disease   . Diverticulosis   . Heart attack   . Hematoma    Subcapsule renal  . HTN (hypertension)   . Hyperlipidemia   . Nephrolithiasis   . PVC (premature ventricular contraction)     Past Surgical History:  Procedure Laterality Date  . CARDIAC SURGERY    . CORONARY ARTERY BYPASS GRAFT     LIMA to the LAD, SVG to ramus intermediate, SVG to right coronary artery 08/2012  . HEMORRHOID SURGERY     x 2  . INGUINAL HERNIA REPAIR Bilateral   . Kidney stents    . LITHOTRIPSY     x 3  . Rotator cufff Right   . VASECTOMY      Family History  Problem Relation Age of Onset  . Coronary artery disease Father 53  . Heart disease Father   . Heart attack Maternal Grandfather   . Prostate cancer Paternal Grandfather   . Ovarian cancer Paternal Grandmother     mets  . Hypertension Mother   . Arthritis/Rheumatoid Mother   . Emphysema Mother   . COPD  Mother   . Neuropathy Brother   . Liver disease Brother   . Healthy Brother     Social History:  reports that he has been smoking Cigarettes.  He has a 6.25 pack-year smoking history. He has never used smokeless tobacco. He reports that he does not drink alcohol or use drugs.  Allergies:  Allergies  Allergen Reactions  . Codeine Nausea And Vomiting  . Demerol [Meperidine] Other (See Comments)    Becomes angry   . Dilaudid [Hydromorphone Hcl] Nausea And Vomiting    IV  . Morphine And Related Nausea And Vomiting     Medications: Prior to Admission medications   Medication Sig Start Date End Date Taking? Authorizing Provider  aspirin EC 81 MG tablet Take 81 mg by mouth daily. 10/02/12  Yes Minus Breeding, MD  fluticasone (FLONASE) 50 MCG/ACT nasal spray Place 2 sprays into both nostrils every evening.   Yes Historical Provider, MD  ibuprofen (ADVIL,MOTRIN) 400 MG tablet Take 1 tablet (400 mg total) by mouth 3 (three) times daily. For 5 days. Stay well-hydrated. 07/22/15  Yes Samuella Cota, MD  loratadine (CLARITIN) 10 MG tablet Take 10 mg by mouth at bedtime.    Yes Historical Provider, MD    Scheduled Meds: . aspirin  325 mg Oral Daily  . atorvastatin  40 mg Oral q1800  . azithromycin  500 mg Intravenous Q24H  . enoxaparin (LOVENOX) injection  40 mg Subcutaneous Q24H  . fluticasone  2 spray Each Nare QPM  . loratadine  10 mg Oral QHS  . magnesium oxide  400 mg Oral BID   Continuous Infusions: PRN Meds:.acetaminophen **OR** acetaminophen (TYLENOL) oral liquid 160 mg/5 mL **OR** acetaminophen, guaiFENesin, guaiFENesin-dextromethorphan, HYDROmorphone, ibuprofen, labetalol, nitroGLYCERIN, pseudoephedrine, senna-docusate     Results for orders placed or performed during the hospital encounter of 11/20/16 (from the past 48 hour(s))  Basic metabolic panel     Status: Abnormal   Collection Time: 11/20/16  8:24 PM  Result Value Ref Range   Sodium 138 135 - 145 mmol/L   Potassium 3.2 (L) 3.5 - 5.1 mmol/L   Chloride 107 101 - 111 mmol/L   CO2 22 22 - 32 mmol/L   Glucose, Bld 86 65 - 99 mg/dL   BUN 7 6 - 20 mg/dL   Creatinine, Ser 1.18 0.61 - 1.24 mg/dL   Calcium 8.9 8.9 - 10.3 mg/dL   GFR calc non Af Amer >60 >60 mL/min   GFR calc Af Amer >60 >60 mL/min    Comment: (NOTE) The eGFR has been calculated using the CKD EPI equation. This calculation has not been validated in all clinical situations. eGFR's persistently <60 mL/min signify possible Chronic Kidney Disease.    Anion gap 9 5 - 15   CBC     Status: None   Collection Time: 11/20/16  8:24 PM  Result Value Ref Range   WBC 4.6 4.0 - 10.5 K/uL   RBC 4.94 4.22 - 5.81 MIL/uL   Hemoglobin 15.7 13.0 - 17.0 g/dL   HCT 45.1 39.0 - 52.0 %   MCV 91.3 78.0 - 100.0 fL   MCH 31.8 26.0 - 34.0 pg   MCHC 34.8 30.0 - 36.0 g/dL   RDW 12.8 11.5 - 15.5 %   Platelets 189 150 - 400 K/uL  I-stat troponin, ED     Status: None   Collection Time: 11/20/16  8:48 PM  Result Value Ref Range   Troponin i, poc 0.00 0.00 - 0.08 ng/mL  Comment 3            Comment: Due to the release kinetics of cTnI, a negative result within the first hours of the onset of symptoms does not rule out myocardial infarction with certainty. If myocardial infarction is still suspected, repeat the test at appropriate intervals.   HIV antibody (Routine Testing)     Status: None   Collection Time: 11/20/16 11:29 PM  Result Value Ref Range   HIV Screen 4th Generation wRfx Non Reactive Non Reactive    Comment: (NOTE) Performed At: [facility] [place], [place] [id number] Lindon Romp MD GY:[id number]   Hepatic function panel     Status: Abnormal   Collection Time: 11/20/16 11:29 PM  Result Value Ref Range   Total Protein 6.3 (L) 6.5 - 8.1 g/dL   Albumin 3.3 (L) 3.5 - 5.0 g/dL   AST 31 15 - 41 U/L   ALT 35 17 - 63 U/L   Alkaline Phosphatase 121 38 - 126 U/L   Total Bilirubin 0.5 0.3 - 1.2 mg/dL   Bilirubin, Direct 0.1 0.1 - 0.5 mg/dL   Indirect Bilirubin 0.4 0.3 - 0.9 mg/dL  Troponin I (q 6hr x 3)     Status: None   Collection Time: 11/20/16 11:29 PM  Result Value Ref Range   Troponin I <0.03 <0.03 ng/mL  Hemoglobin A1c     Status: Abnormal   Collection Time: 11/21/16  4:56 AM  Result Value Ref Range   Hgb A1c MFr Bld 5.9 (H) 4.8 - 5.6 %    Comment: (NOTE)         Pre-diabetes: 5.7 - 6.4         Diabetes: >6.4         Glycemic control for adults with diabetes: <7.0    Mean Plasma Glucose 123 mg/dL    Comment:  (NOTE) Performed At: [facility] [place], [place] [id number] Lindon Romp MD EX:[id number]   Lipid panel     Status: Abnormal   Collection Time: 11/21/16  4:56 AM  Result Value Ref Range   Cholesterol 157 0 - 200 mg/dL   Triglycerides 78 <150 mg/dL   HDL 26 (L) >40 mg/dL   Total CHOL/HDL Ratio 6.0 RATIO   VLDL 16 0 - 40 mg/dL   LDL Cholesterol 115 (H) 0 - 99 mg/dL    Comment:        Total Cholesterol/HDL:CHD Risk Coronary Heart Disease Risk Table                     Men   Women  1/2 Average Risk   3.4   3.3  Average Risk       5.0   4.4  2 X Average Risk   9.6   7.1  3 X Average Risk  23.4   11.0        Use the calculated Patient Ratio above and the CHD Risk Table to determine the patient's CHD Risk.        ATP III CLASSIFICATION (LDL):  <100     mg/dL   Optimal  100-129  mg/dL   Near or Above                    Optimal  130-159  mg/dL   Borderline  160-189  mg/dL   High  >190     mg/dL   Very High   Troponin I (  q 6hr x 3)     Status: None   Collection Time: 11/21/16  4:56 AM  Result Value Ref Range   Troponin I <0.03 <0.03 ng/mL  Basic metabolic panel     Status: Abnormal   Collection Time: 11/21/16  4:56 AM  Result Value Ref Range   Sodium 139 135 - 145 mmol/L   Potassium 4.0 3.5 - 5.1 mmol/L    Comment: DELTA CHECK NOTED   Chloride 110 101 - 111 mmol/L   CO2 24 22 - 32 mmol/L   Glucose, Bld 102 (H) 65 - 99 mg/dL   BUN 8 6 - 20 mg/dL   Creatinine, Ser 1.05 0.61 - 1.24 mg/dL   Calcium 8.4 (L) 8.9 - 10.3 mg/dL   GFR calc non Af Amer >60 >60 mL/min   GFR calc Af Amer >60 >60 mL/min    Comment: (NOTE) The eGFR has been calculated using the CKD EPI equation. This calculation has not been validated in all clinical situations. eGFR's persistently <60 mL/min signify possible Chronic Kidney Disease.    Anion gap 5 5 - 15  Troponin I (q 6hr x 3)     Status: None   Collection Time: 11/21/16 11:49 AM  Result Value Ref Range   Troponin I  <0.03 <0.03 ng/mL  Vitamin B12     Status: None   Collection Time: 11/21/16  9:06 PM  Result Value Ref Range   Vitamin B-12 342 180 - 914 pg/mL    Comment: (NOTE) This assay is not validated for testing neonatal or myeloproliferative syndrome specimens for Vitamin B12 levels. Performed at [facility], [place] [address] [place], [place] [address]   TSH     Status: None   Collection Time: 11/21/16  9:06 PM  Result Value Ref Range   TSH 4.159 0.350 - 4.500 uIU/mL    Comment: Performed by a 3rd Generation assay with a functional sensitivity of <=0.01 uIU/mL.  C-reactive protein     Status: Abnormal   Collection Time: 11/21/16  9:06 PM  Result Value Ref Range   CRP 1.3 (H) <1.0 mg/dL    Comment: Performed at [facility], [place] [address] [place], [place] [address]  Sedimentation rate     Status: None   Collection Time: 11/21/16  9:06 PM  Result Value Ref Range   Sed Rate 15 0 - 16 mm/hr  Basic metabolic panel     Status: Abnormal   Collection Time: 11/22/16 11:23 AM  Result Value Ref Range   Sodium 138 135 - 145 mmol/L   Potassium 3.9 3.5 - 5.1 mmol/L   Chloride 107 101 - 111 mmol/L   CO2 26 22 - 32 mmol/L   Glucose, Bld 107 (H) 65 - 99 mg/dL   BUN 10 6 - 20 mg/dL   Creatinine, Ser 1.21 0.61 - 1.24 mg/dL   Calcium 8.4 (L) 8.9 - 10.3 mg/dL   GFR calc non Af Amer >60 >60 mL/min   GFR calc Af Amer >60 >60 mL/min    Comment: (NOTE) The eGFR has been calculated using the CKD EPI equation. This calculation has not been validated in all clinical situations. eGFR's persistently <60 mL/min signify possible Chronic Kidney Disease.    Anion gap 5 5 - 15  Magnesium     Status: None   Collection Time: 11/22/16 11:23 AM  Result Value Ref Range   Magnesium 1.7 1.7 - 2.4 mg/dL    Studies/Results:  NECK MRA FINDINGS:  Time-of-flight MRA imaging of the neck reveals antegrade flow in both carotid and vertebral arteries throughout the neck and to the skullbase. The right  vertebral artery appears mildly dominant.  No evidence of stenosis at the right carotid bifurcation. Negative cervical right ICA.  No evidence of stenosis at the left carotid bifurcation. Negative cervical left ICA.  Vertebral artery origins are not well identified, especially the left. No vertebral artery abnormality identified in the neck.  IMPRESSION: Negative noncontrast neck MRA.       BRAIN MRA FINDINGS: Antegrade flow in the posterior circulation. Mildly dominant distal right vertebral artery, and the distal left vertebral artery functionally terminates in PICA. Right PICA origin is patent. Patent vertebrobasilar junction. Patent basilar artery without stenosis. Normal SCA origins. Fetal type bilateral PCA origins, especially the right. Bilateral PCA branches appear normal.  Antegrade flow in both ICA siphons. Mild cavernous ICA tortuosity. No siphon stenosis. Normal ophthalmic and posterior communicating artery origins. Normal carotid termini, MCA and ACA origins. Anterior communicating artery and the visible ACA branches appear normal with a median artery of the corpus callosum (normal variant). Early left MCA branching. Left MCA bifurcation and visible branches are within normal limits. Right MCA M1 segment, bifurcation, and visible right MCA branches are within normal limits.  IMPRESSION: Negative intracranial MRA.    BRAIN MRI FINDINGS: Brain: There is no evidence of acute infarct, intracranial hemorrhage, mass, midline shift, or extra-axial fluid collection. The ventricles and sulci are normal. The brain is normal in signal.  Vascular: Major intracranial vascular flow voids are preserved.  Skull and upper cervical spine: No suspicious marrow lesion.  Sinuses/Orbits: Unremarkable orbits.  No significant sinus disease.  Other: None.  IMPRESSION: Unremarkable brain MRI.      The brain MRI is reviewed in person. No acute lesions are  seen on DWI. No white matter lesions are seen. No evidence of hemorrhage or old infarct.      ECHO - Left ventricle: The cavity size was normal. Wall thickness was   increased in a pattern of mild LVH. Systolic function was normal.   The estimated ejection fraction was in the range of 55% to 60%.   Wall motion was normal; there were no regional wall motion   abnormalities. Left ventricular diastolic function parameters   were normal. - Aortic valve: Mildly calcified annulus. Trileaflet; normal   thickness leaflets. Valve area (VTI): 2.53 cm^2. Valve area   (Vmax): 2.6 cm^2. - Mitral valve: Mildly calcified annulus. Normal thickness leaflets   . - Technically adequate study.  Kain Milosevic A. Merlene Laughter, M.D.  Diplomate, Tax adviser of Psychiatry and Neurology ( Neurology). 11/22/2016, 5:35 PM

## 2016-11-22 NOTE — Progress Notes (Signed)
Pt states headache now back 8/10; requesting decongestant. Dr Conley RollsLe paged and made aware. Robitussin given for cough.

## 2016-11-23 LAB — HOMOCYSTEINE: Homocysteine: 13.7 umol/L (ref 0.0–15.0)

## 2016-11-23 LAB — RPR: RPR Ser Ql: NONREACTIVE

## 2016-11-23 MED ORDER — AZITHROMYCIN 500 MG PO TABS: 500.0000 mg | ORAL_TABLET | Freq: Every day | ORAL | 0 refills | Status: AC

## 2016-11-23 MED ORDER — PREDNISONE 10 MG (21) PO TBPK: ORAL_TABLET | ORAL | 0 refills | Status: AC

## 2016-11-23 MED ORDER — GUAIFENESIN-DM 100-10 MG/5ML PO SYRP: 5.0000 mL | ORAL_SOLUTION | ORAL | 0 refills | Status: AC | PRN

## 2016-11-23 MED ORDER — ASPIRIN 325 MG PO TABS: 325.0000 mg | ORAL_TABLET | Freq: Every day | ORAL | 0 refills | Status: AC

## 2016-11-23 MED ORDER — LOVASTATIN 40 MG PO TABS: 40.0000 mg | ORAL_TABLET | Freq: Every day | ORAL | 0 refills | Status: AC

## 2016-11-23 NOTE — Progress Notes (Signed)
Discharge instructions given on medications and follow up visits,patient verbalized understanding. Prescriptions sent to Pharmacy of choice documented on AVS. Accompanied by staff to an awaiting vehicle. 

## 2016-11-23 NOTE — Progress Notes (Signed)
[place] A. Merlene Laughter, MD     [email or web address]          Juan Wade is an 46 y.o. male.   ASSESSMENT/PLAN: 1. Recurrent episode of blurred vision and gait ataxia/dizziness of unclear etiology. This may represent a TIA given his risk factors however. I agree with the increase in the patient aspirin to 325 mg. 2. Acute unilateral hemicranial headache: - He seemed to have some sinus symptoms suggestive of acute sinusitis. I will try patient on bolus of steroids to help with symptoms. 3. Acute and chronic chest pain 4. Fatigue - I suggest outpatient polysomnography to evaluate for obstructive sleep apnea syndrome.    Responded very well to steroids. Only mild HA. Sinusitis symptoms also better      GENERAL: Pleasant male in no acute distress.  HEENT: Neck is supple head is normocephalic atraumatic. No scalp tenderness appreciated.  ABDOMEN: soft  EXTREMITIES: No edema   BACK: Normal  SKIN: Normal by inspection.    MENTAL STATUS: Alert and oriented. Speech, language and cognition are generally intact. Judgment and insight normal.   CRANIAL NERVES: Pupils are equal, round and reactive to light and accomodation; extra ocular movements are full, there is no significant nystagmus; visual fields are full; upper and lower facial muscles are normal in strength and symmetric, there is no flattening of the nasolabial folds; tongue is midline; uvula is midline; shoulder elevation is normal.  MOTOR: Normal tone, bulk and strength; no pronator drift.  COORDINATION: Left finger to nose is normal, right finger to nose is normal, No rest tremor; no intention tremor; no postural tremor; no bradykinesia.  REFLEXES: Deep tendon reflexes are symmetrical and normal. Babinski reflexes are flexor bilaterally.   SENSATION: Normal to light touch, temperature, and pinprick.  GAIT: Normal.    Echo looks good with ejection fraction of 55%.  Blood pressure 101/61, pulse 72,  temperature 97.7 F (36.5 C), temperature source Oral, resp. rate 18, height 5' 8"  (1.727 m), weight 195 lb 1.6 oz (88.5 kg), SpO2 92 %.  Past Medical History:  Diagnosis Date  . Coronary artery disease   . Diverticulosis   . Heart attack   . Hematoma    Subcapsule renal  . HTN (hypertension)   . Hyperlipidemia   . Nephrolithiasis   . PVC (premature ventricular contraction)     Past Surgical History:  Procedure Laterality Date  . CARDIAC SURGERY    . CORONARY ARTERY BYPASS GRAFT     LIMA to the LAD, SVG to ramus intermediate, SVG to right coronary artery 08/2012  . HEMORRHOID SURGERY     x 2  . INGUINAL HERNIA REPAIR Bilateral   . Kidney stents    . LITHOTRIPSY     x 3  . Rotator cufff Right   . VASECTOMY      Family History  Problem Relation Age of Onset  . Coronary artery disease Father 99  . Heart disease Father   . Heart attack Maternal Grandfather   . Prostate cancer Paternal Grandfather   . Ovarian cancer Paternal Grandmother     mets  . Hypertension Mother   . Arthritis/Rheumatoid Mother   . Emphysema Mother   . COPD Mother   . Neuropathy Brother   . Liver disease Brother   . Healthy Brother     Social History:  reports that he has been smoking Cigarettes.  He has a 6.25 pack-year smoking history. He has never used smokeless tobacco. He reports that  he does not drink alcohol or use drugs.  Allergies:  Allergies  Allergen Reactions  . Codeine Nausea And Vomiting  . Demerol [Meperidine] Other (See Comments)    Becomes angry   . Dilaudid [Hydromorphone Hcl] Nausea And Vomiting    IV  . Morphine And Related Nausea And Vomiting    Medications: Prior to Admission medications   Medication Sig Start Date End Date Taking? Authorizing Provider  aspirin EC 81 MG tablet Take 81 mg by mouth daily. 10/02/12  Yes Minus Breeding, MD  fluticasone (FLONASE) 50 MCG/ACT nasal spray Place 2 sprays into both nostrils every evening.   Yes Historical Provider, MD    ibuprofen (ADVIL,MOTRIN) 400 MG tablet Take 1 tablet (400 mg total) by mouth 3 (three) times daily. For 5 days. Stay well-hydrated. 07/22/15  Yes Samuella Cota, MD  loratadine (CLARITIN) 10 MG tablet Take 10 mg by mouth at bedtime.    Yes Historical Provider, MD    Scheduled Meds: . aspirin  325 mg Oral Daily  . atorvastatin  40 mg Oral q1800  . azithromycin  500 mg Intravenous Q24H  . enoxaparin (LOVENOX) injection  40 mg Subcutaneous Q24H  . fluticasone  2 spray Each Nare QPM  . loratadine  10 mg Oral QHS  . magnesium oxide  400 mg Oral BID  . methylPREDNISolone (SOLU-MEDROL) injection  500 mg Intravenous Q24H   Continuous Infusions: PRN Meds:.acetaminophen **OR** acetaminophen (TYLENOL) oral liquid 160 mg/5 mL **OR** acetaminophen, guaiFENesin, guaiFENesin-dextromethorphan, HYDROmorphone, ibuprofen, labetalol, nitroGLYCERIN, ondansetron (ZOFRAN) IV, pseudoephedrine, senna-docusate     Results for orders placed or performed during the hospital encounter of 11/20/16 (from the past 48 hour(s))  RPR     Status: None   Collection Time: 11/21/16  9:06 PM  Result Value Ref Range   RPR Ser Ql Non Reactive Non Reactive    Comment: (NOTE) Performed At: [facility] [place], [place] [id number] Lindon Romp MD [id number]   Vitamin B12     Status: None   Collection Time: 11/21/16  9:06 PM  Result Value Ref Range   Vitamin B-12 342 180 - 914 pg/mL    Comment: (NOTE) This assay is not validated for testing neonatal or myeloproliferative syndrome specimens for Vitamin B12 levels. Performed at [facility], [place] [address] [place], [place] [address]   TSH     Status: None   Collection Time: 11/21/16  9:06 PM  Result Value Ref Range   TSH 4.159 0.350 - 4.500 uIU/mL    Comment: Performed by a 3rd Generation assay with a functional sensitivity of <=0.01 uIU/mL.  C-reactive protein     Status: Abnormal   Collection Time: 11/21/16  9:06 PM  Result  Value Ref Range   CRP 1.3 (H) <1.0 mg/dL    Comment: Performed at [facility], [place] [address] [place], [place] [address]  Sedimentation rate     Status: None   Collection Time: 11/21/16  9:06 PM  Result Value Ref Range   Sed Rate 15 0 - 16 mm/hr  Basic metabolic panel     Status: Abnormal   Collection Time: 11/22/16 11:23 AM  Result Value Ref Range   Sodium 138 135 - 145 mmol/L   Potassium 3.9 3.5 - 5.1 mmol/L   Chloride 107 101 - 111 mmol/L   CO2 26 22 - 32 mmol/L   Glucose, Bld 107 (H) 65 - 99 mg/dL   BUN 10 6 - 20 mg/dL  Creatinine, Ser 1.21 0.61 - 1.24 mg/dL   Calcium 8.4 (L) 8.9 - 10.3 mg/dL   GFR calc non Af Amer >60 >60 mL/min   GFR calc Af Amer >60 >60 mL/min    Comment: (NOTE) The eGFR has been calculated using the CKD EPI equation. This calculation has not been validated in all clinical situations. eGFR's persistently <60 mL/min signify possible Chronic Kidney Disease.    Anion gap 5 5 - 15  Magnesium     Status: None   Collection Time: 11/22/16 11:23 AM  Result Value Ref Range   Magnesium 1.7 1.7 - 2.4 mg/dL    Studies/Results:  NECK MRA FINDINGS: Time-of-flight MRA imaging of the neck reveals antegrade flow in both carotid and vertebral arteries throughout the neck and to the skullbase. The right vertebral artery appears mildly dominant.  No evidence of stenosis at the right carotid bifurcation. Negative cervical right ICA.  No evidence of stenosis at the left carotid bifurcation. Negative cervical left ICA.  Vertebral artery origins are not well identified, especially the left. No vertebral artery abnormality identified in the neck.  IMPRESSION: Negative noncontrast neck MRA.       BRAIN MRA FINDINGS: Antegrade flow in the posterior circulation. Mildly dominant distal right vertebral artery, and the distal left vertebral artery functionally terminates in PICA. Right PICA origin is patent. Patent vertebrobasilar junction. Patent  basilar artery without stenosis. Normal SCA origins. Fetal type bilateral PCA origins, especially the right. Bilateral PCA branches appear normal.  Antegrade flow in both ICA siphons. Mild cavernous ICA tortuosity. No siphon stenosis. Normal ophthalmic and posterior communicating artery origins. Normal carotid termini, MCA and ACA origins. Anterior communicating artery and the visible ACA branches appear normal with a median artery of the corpus callosum (normal variant). Early left MCA branching. Left MCA bifurcation and visible branches are within normal limits. Right MCA M1 segment, bifurcation, and visible right MCA branches are within normal limits.  IMPRESSION: Negative intracranial MRA.    BRAIN MRI FINDINGS: Brain: There is no evidence of acute infarct, intracranial hemorrhage, mass, midline shift, or extra-axial fluid collection. The ventricles and sulci are normal. The brain is normal in signal.  Vascular: Major intracranial vascular flow voids are preserved.  Skull and upper cervical spine: No suspicious marrow lesion.  Sinuses/Orbits: Unremarkable orbits.  No significant sinus disease.  Other: None.  IMPRESSION: Unremarkable brain MRI.      The brain MRI is reviewed in person. No acute lesions are seen on DWI. No white matter lesions are seen. No evidence of hemorrhage or old infarct.      ECHO - Left ventricle: The cavity size was normal. Wall thickness was   increased in a pattern of mild LVH. Systolic function was normal.   The estimated ejection fraction was in the range of 55% to 60%.   Wall motion was normal; there were no regional wall motion   abnormalities. Left ventricular diastolic function parameters   were normal. - Aortic valve: Mildly calcified annulus. Trileaflet; normal   thickness leaflets. Valve area (VTI): 2.53 cm^2. Valve area   (Vmax): 2.6 cm^2. - Mitral valve: Mildly calcified annulus. Normal thickness leaflets   . -  Technically adequate study.  Kashton Mcartor A. Merlene Laughter, M.D.  Diplomate, Tax adviser of Psychiatry and Neurology ( Neurology). 11/23/2016, 1:17 PM

## 2016-11-26 NOTE — Discharge Summary (Signed)
Physician Discharge Summary  Juan Wade TXM:[id number] DOB: Oct 16, 1970 DOA: 11/20/2016  PCP: Curlene Labrum, MD  Admit date: 11/20/2016 Discharge date: 11/24/2016  Admitted From: Home.  Disposition:  Home.   Recommendations for Outpatient Follow-up:  1. Follow up with PCP in 1-2 weeks 2. Please obtain BMP/CBC in one week Please follow up with neurology as needed.   Discharge Condition:stable.  CODE STATUS: full code.  Diet recommendation: Heart Healthy   Brief/Interim Summary: Juan Wade a 46 y.o.malewith medical history significant for CAD status post CABG in 2014, and allergic rhinitis who presents to the emergency department with 2-3 days of intermittent chest pain, visual impairment, and gait instability. Currently he denies any chest pain. He was admitted for evaluation of TIA. Work up has been negative and neurology consulted, recommendations given and discharged to follow up with PCP as outpatient.    Discharge Diagnoses:  Principal Problem:   Chest pain at rest Active Problems:   CAD (coronary artery disease) of artery bypass graft   Hypokalemia   Gait difficulty   Vision disturbance   TIA (transient ischemic attack)   Atypical chest pain: pt has a h/o CAD with CABG:   Currently no chest pain, worsens pain on coughing . Had an upper respiratory tract infection for the last 3 to 4 days.  CXR no disease.  troponins are negative.  EKG shows sinus rhythm and no ischemic changes.  Echocardiogram ordered and unremarkable.   Gait instability and blurry vision:  Over the last few days. Currently no vision impairment. MRI brain ruled out acute stroke. MRA of the head and neck ,are negative.  Neurology consulted for recommendations given.  Lipid panel and hgba1c ordered. hgba1c is 5.9 and lipid panel shows HDL Of 26 and LDL of 115. He has been off statins for more than a year as he has issues with insurance.  PT/OT /SLP , resume aspirin and added statin to his  regimen.    Hypokalemia: repleted.   Left hemi cranial headache: ESR normal, CRP, and vit b12 pending. TSH wnl. HIV negative.  Headache probably from sinus congestion and cold. Improved with steroids.      Discharge Instructions  Discharge Instructions    Diet - low sodium heart healthy    Complete by:  As directed    Discharge instructions    Complete by:  As directed    Follow up with PCP in 1 to 2 weeks.     Allergies as of 11/23/2016      Reactions   Codeine Nausea And Vomiting   Demerol [meperidine] Other (See Comments)   Becomes angry    Dilaudid [hydromorphone Hcl] Nausea And Vomiting   IV   Morphine And Related Nausea And Vomiting      Medication List    STOP taking these medications   aspirin EC 81 MG tablet Replaced by:  aspirin 325 MG tablet   ibuprofen 400 MG tablet Commonly known as:  ADVIL,MOTRIN     TAKE these medications   aspirin 325 MG tablet Take 1 tablet (325 mg total) by mouth daily. Replaces:  aspirin EC 81 MG tablet   fluticasone 50 MCG/ACT nasal spray Commonly known as:  FLONASE Place 2 sprays into both nostrils every evening.   guaiFENesin-dextromethorphan 100-10 MG/5ML syrup Commonly known as:  ROBITUSSIN DM Take 5 mLs by mouth every 4 (four) hours as needed for cough.   loratadine 10 MG tablet Commonly known as:  CLARITIN Take 10 mg by mouth  at bedtime.   lovastatin 40 MG tablet Commonly known as:  MEVACOR Take 1 tablet (40 mg total) by mouth at bedtime.   predniSONE 10 MG (21) Tbpk tablet Commonly known as:  STERAPRED UNI-PAK 21 TAB Prednisone 40 mg daily for 5 days and stop.     ASK your doctor about these medications   azithromycin 500 MG tablet Commonly known as:  ZITHROMAX Take 1 tablet (500 mg total) by mouth daily. Ask about: Should I take this medication?      Follow-up Information    Curlene Labrum, MD. Schedule an appointment as soon as possible for a visit in 1 week(s).   Specialty:  Family  Medicine Why:  post hospitalization visit.  Contact information: [address] [place] [address] [phone]          Allergies  Allergen Reactions  . Codeine Nausea And Vomiting  . Demerol [Meperidine] Other (See Comments)    Becomes angry   . Dilaudid [Hydromorphone Hcl] Nausea And Vomiting    IV  . Morphine And Related Nausea And Vomiting    Consultations:  Neurology.    Procedures/Studies: Dg Chest 2 View  Result Date: 11/20/2016 CLINICAL DATA:  Chest pain and pressure x2 days. History of myocardial infarction and coronary artery disease. EXAM: CHEST  2 VIEW COMPARISON:  07/20/2015 FINDINGS: The heart size and mediastinal contours are within normal limits. Median sternotomy with post CABG change. Both lungs are clear. Minimal atelectasis at the left lung base. No pulmonary edema, effusion or pneumothorax. AC joint osteoarthritis bilaterally. No acute osseous abnormality. IMPRESSION: No active cardiopulmonary disease. Electronically Signed   By: Ashley Royalty M.D.   On: 11/20/2016 20:19   Ct Head Wo Contrast  Result Date: 11/20/2016 CLINICAL DATA:  Blurry vision, nausea and dizziness.  Headache. EXAM: CT HEAD WITHOUT CONTRAST TECHNIQUE: Contiguous axial images were obtained from the base of the skull through the vertex without intravenous contrast. COMPARISON:  03/28/2015 FINDINGS: BRAIN: The ventricles and sulci are normal. No intraparenchymal hemorrhage, mass effect nor midline shift. No acute large vascular territory infarcts. Grey-white matter distinction is maintained. The basal ganglia are unremarkable. No abnormal extra-axial fluid collections. Basal cisterns are not effaced and midline. The brainstem and cerebellar hemispheres are without acute abnormalities. VASCULAR: Unremarkable. SKULL/SOFT TISSUES: No skull fracture. No significant soft tissue swelling. ORBITS/SINUSES: The included ocular globes and orbital contents are normal.The mastoid air cells are clear. The  included paranasal sinuses are well-aerated. OTHER: None. IMPRESSION: Stable appearance the brain.  No acute intracranial abnormality. Electronically Signed   By: Ashley Royalty M.D.   On: 11/20/2016 20:20   Mr Jodene Nam Head Wo Contrast  Result Date: 11/21/2016 CLINICAL DATA:  46 year old male with nausea blurred vision dizziness and headache. Initial encounter. EXAM: MRA HEAD WITHOUT CONTRAST TECHNIQUE: Angiographic images of the Circle of Willis were obtained using MRA technique without intravenous contrast. COMPARISON:  Brain MRI 0755 hours today. Neck MRA from today reported separately. FINDINGS: Antegrade flow in the posterior circulation. Mildly dominant distal right vertebral artery, and the distal left vertebral artery functionally terminates in PICA. Right PICA origin is patent. Patent vertebrobasilar junction. Patent basilar artery without stenosis. Normal SCA origins. Fetal type bilateral PCA origins, especially the right. Bilateral PCA branches appear normal. Antegrade flow in both ICA siphons. Mild cavernous ICA tortuosity. No siphon stenosis. Normal ophthalmic and posterior communicating artery origins. Normal carotid termini, MCA and ACA origins. Anterior communicating artery and the visible ACA branches appear normal with a median  artery of the corpus callosum (normal variant). Early left MCA branching. Left MCA bifurcation and visible branches are within normal limits. Right MCA M1 segment, bifurcation, and visible right MCA branches are within normal limits. IMPRESSION: Negative intracranial MRA. Electronically Signed   By: Genevie Ann M.D.   On: 11/21/2016 11:53   Mr Jodene Nam Neck Wo Contrast  Result Date: 11/21/2016 CLINICAL DATA:  46 year old male with nausea blurred vision dizziness and headache. Initial encounter. EXAM: MRA NECK WITHOUT CONTRAST TECHNIQUE: Angiographic images of the neck were obtained using MRA technique without intravenous contrast. Carotid stenosis measurements (when applicable) are  obtained utilizing NASCET criteria, using the distal internal carotid diameter as the denominator. COMPARISON:  Brain MRI and intracranial MRA today reported separately. FINDINGS: Time-of-flight MRA imaging of the neck reveals antegrade flow in both carotid and vertebral arteries throughout the neck and to the skullbase. The right vertebral artery appears mildly dominant. No evidence of stenosis at the right carotid bifurcation. Negative cervical right ICA. No evidence of stenosis at the left carotid bifurcation. Negative cervical left ICA. Vertebral artery origins are not well identified, especially the left. No vertebral artery abnormality identified in the neck. IMPRESSION: Negative noncontrast neck MRA. Electronically Signed   By: Genevie Ann M.D.   On: 11/21/2016 11:51   Mr Brain Wo Contrast  Result Date: 11/21/2016 CLINICAL DATA:  Nausea, blurred vision, dizziness, and headache. EXAM: MRI HEAD WITHOUT CONTRAST TECHNIQUE: Multiplanar, multiecho pulse sequences of the brain and surrounding structures were obtained without intravenous contrast. COMPARISON:  11/20/2016 FINDINGS: Brain: There is no evidence of acute infarct, intracranial hemorrhage, mass, midline shift, or extra-axial fluid collection. The ventricles and sulci are normal. The brain is normal in signal. Vascular: Major intracranial vascular flow voids are preserved. Skull and upper cervical spine: No suspicious marrow lesion. Sinuses/Orbits: Unremarkable orbits.  No significant sinus disease. Other: None. IMPRESSION: Unremarkable brain MRI. Electronically Signed   By: Logan Bores M.D.   On: 11/21/2016 08:20       Subjective: Reports feeling much better, no chest pain, or sob, headache resolved.   Discharge Exam: Vitals:   11/23/16 0624 11/23/16 1449  BP: 101/61 123/70  Pulse: 72 94  Resp: 18 18  Temp: 97.7 F (36.5 C) 98.2 F (36.8 C)   Vitals:   11/22/16 2015 11/22/16 2248 11/23/16 0624 11/23/16 1449  BP:  (!) 100/56 101/61  123/70  Pulse:  83 72 94  Resp:  18 18 18   Temp:  98.6 F (37 C) 97.7 F (36.5 C) 98.2 F (36.8 C)  TempSrc:  Oral Oral Oral  SpO2: 94% 95% 92% 97%  Weight:      Height:        General: Pt is alert, awake, not in acute distress Cardiovascular: RRR, S1/S2 +, no rubs, no gallops Respiratory: CTA bilaterally, no wheezing, no rhonchi Abdominal: Soft, NT, ND, bowel sounds + Extremities: no edema, no cyanosis    The results of significant diagnostics from this hospitalization (including imaging, microbiology, ancillary and laboratory) are listed below for reference.     Microbiology: No results found for this or any previous visit (from the past 240 hour(s)).   Labs: BNP (last 3 results) No results for input(s): BNP in the last 8760 hours. Basic Metabolic Panel:  Recent Labs Lab 11/20/16 2024 11/21/16 0456 11/22/16 1123  NA 138 139 138  K 3.2* 4.0 3.9  CL 107 110 107  CO2 22 24 26   GLUCOSE 86 102* 107*  BUN 7 8  10  CREATININE 1.18 1.05 1.21  CALCIUM 8.9 8.4* 8.4*  MG  --   --  1.7   Liver Function Tests:  Recent Labs Lab 11/20/16 2329  AST 31  ALT 35  ALKPHOS 121  BILITOT 0.5  PROT 6.3*  ALBUMIN 3.3*   No results for input(s): LIPASE, AMYLASE in the last 168 hours. No results for input(s): AMMONIA in the last 168 hours. CBC:  Recent Labs Lab 11/20/16 2024  WBC 4.6  HGB 15.7  HCT 45.1  MCV 91.3  PLT 189   Cardiac Enzymes:  Recent Labs Lab 11/20/16 2329 11/21/16 0456 11/21/16 1149  TROPONINI <0.03 <0.03 <0.03   BNP: Invalid input(s): POCBNP CBG: No results for input(s): GLUCAP in the last 168 hours. D-Dimer No results for input(s): DDIMER in the last 72 hours. Hgb A1c No results for input(s): HGBA1C in the last 72 hours. Lipid Profile No results for input(s): CHOL, HDL, LDLCALC, TRIG, CHOLHDL, LDLDIRECT in the last 72 hours. Thyroid function studies No results for input(s): TSH, T4TOTAL, T3FREE, THYROIDAB in the last 72  hours.  Invalid input(s): FREET3 Anemia work up No results for input(s): VITAMINB12, FOLATE, FERRITIN, TIBC, IRON, RETICCTPCT in the last 72 hours. Urinalysis No results found for: COLORURINE, APPEARANCEUR, LABSPEC, [place], GLUCOSEU, HGBUR, BILIRUBINUR, KETONESUR, PROTEINUR, UROBILINOGEN, NITRITE, LEUKOCYTESUR Sepsis Labs Invalid input(s): PROCALCITONIN,  WBC,  LACTICIDVEN Microbiology No results found for this or any previous visit (from the past 240 hour(s)).   Time coordinating discharge: Over 30 minutes  SIGNED:   Hosie Poisson, MD  [facility] 11/26/2016, 9:59 AM Pager   If 7PM-7AM, please contact night-coverage [email or web address] Password [email or web address]

## 2017-02-14 ENCOUNTER — Other Ambulatory Visit: Payer: Self-pay | Admitting: Interventional Cardiology

## 2018-04-12 IMAGING — MR MR MRA NECK W/O CM
1 series · 12 of 48 positions shown · non-contrast
Comparison: Brain MRI and intracranial MRA today reported
separately.

CLINICAL DATA: 45-year-old male with nausea blurred vision
dizziness and headache. Initial encounter.

EXAM:
MRA NECK WITHOUT CONTRAST
TECHNIQUE: Angiographic images of the neck were obtained using MRA technique
without intravenous contrast. Carotid stenosis measurements (when
applicable) are obtained utilizing NASCET criteria, using the distal
internal carotid diameter as the denominator.

[Series 4: tof_[id number]-slab · axial · 0.8mm · 0.26mm/px · z∈[-247,+17]mm · 12 of 346 slices shown]
[im 1/346]
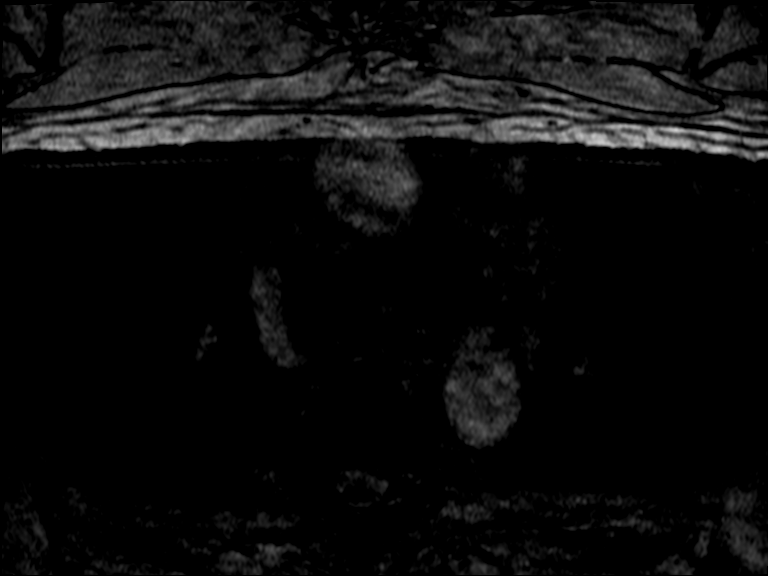
[im 23/346]
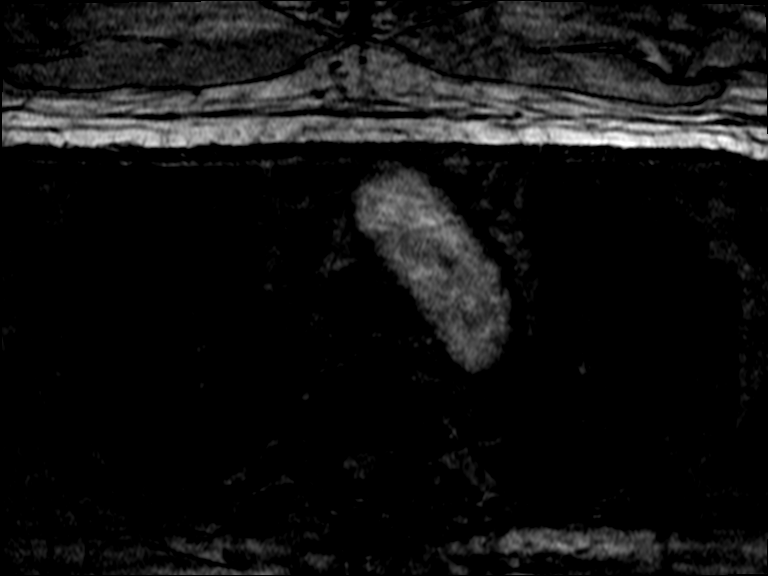
[im 59/346]
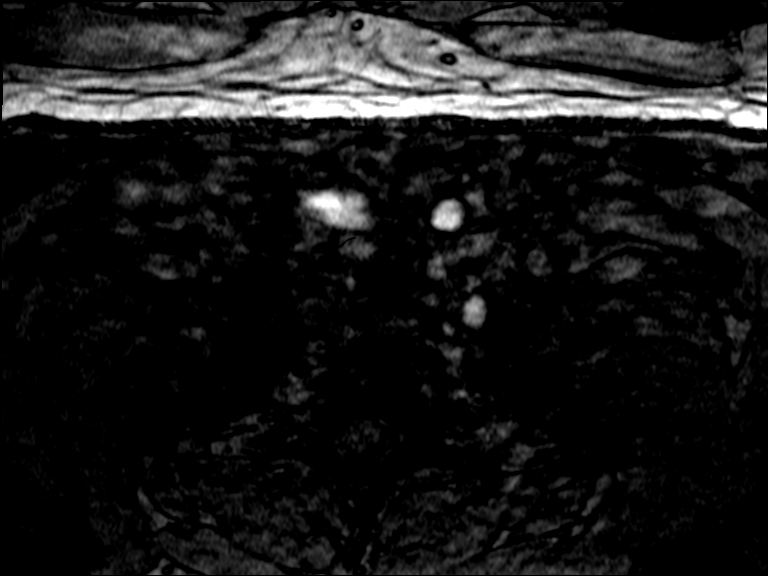
[im 67/346]
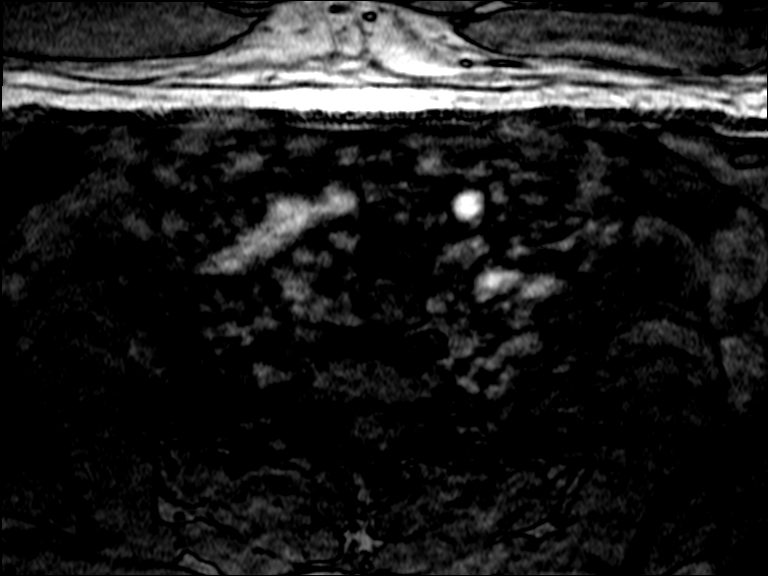
[im 111/346]
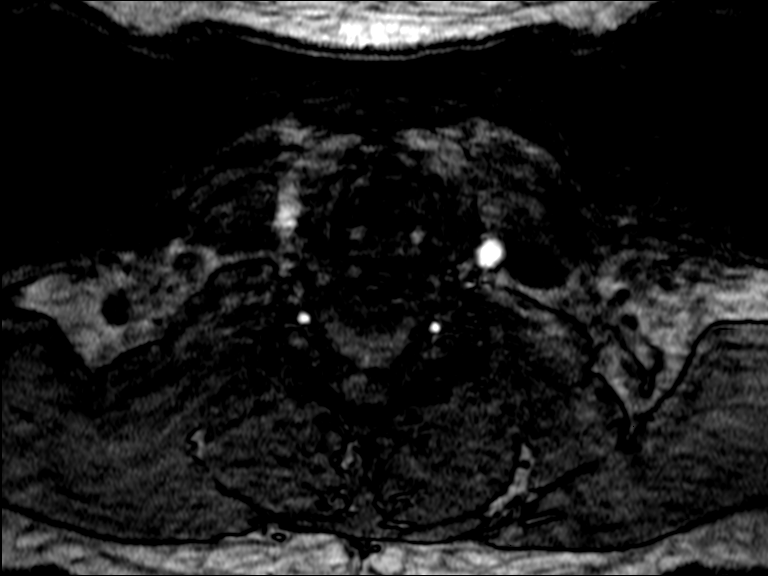
[im 155/346]
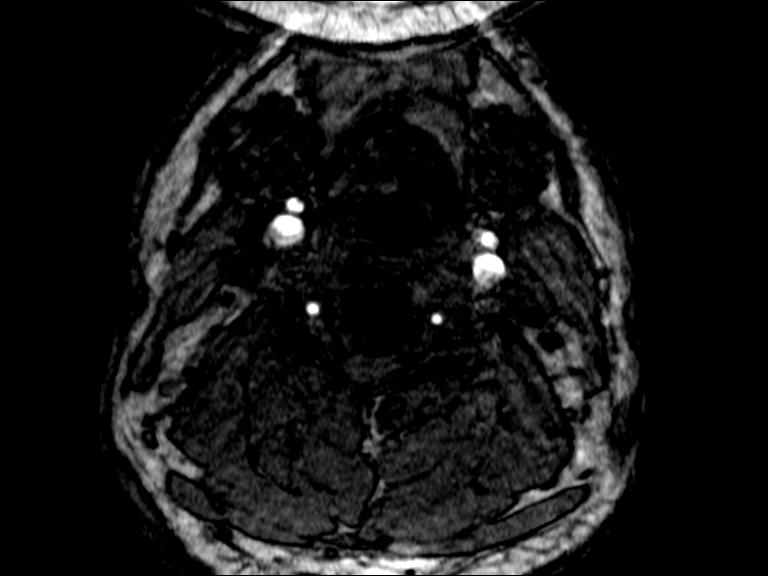
[im 177/346]
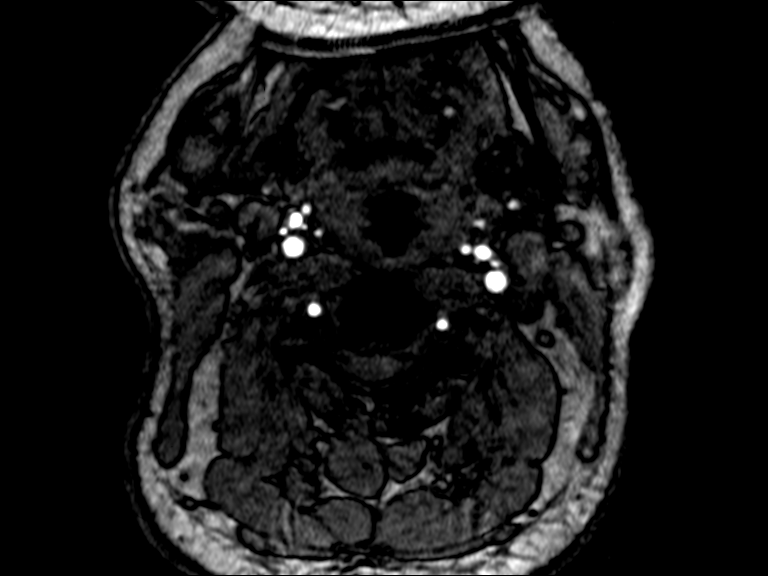
[im 199/346]
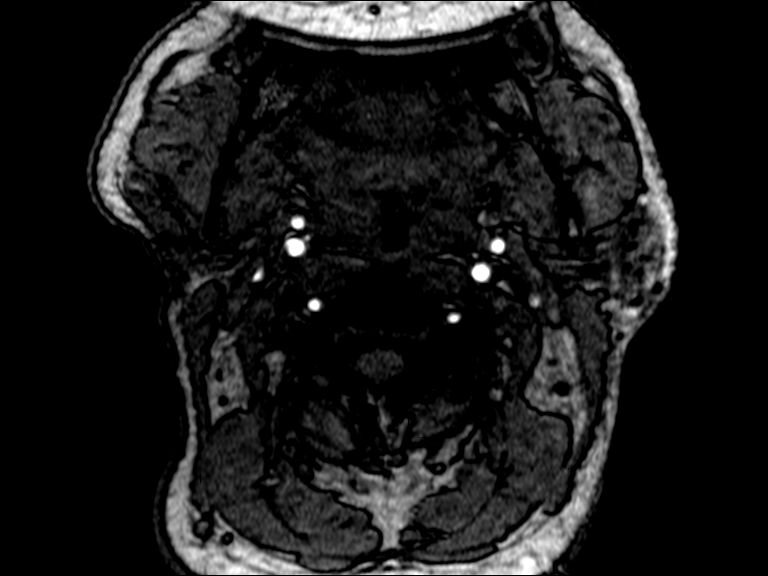
[im 243/346]
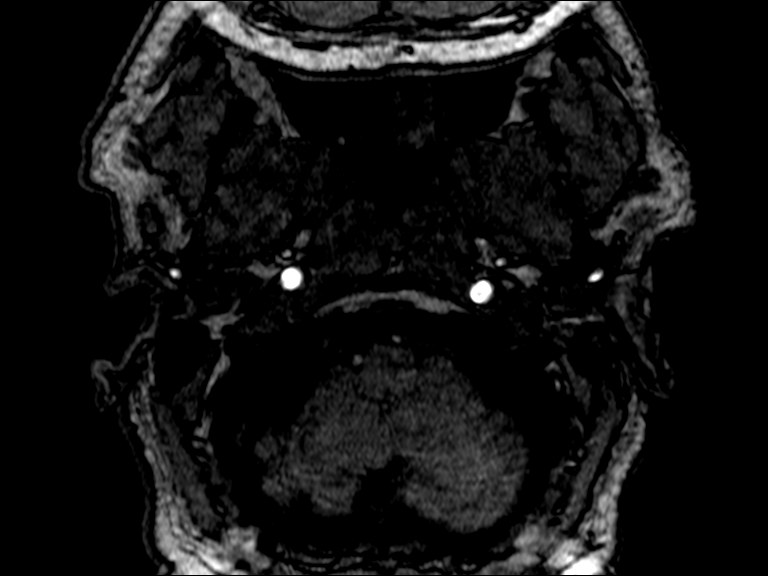
[im 287/346]
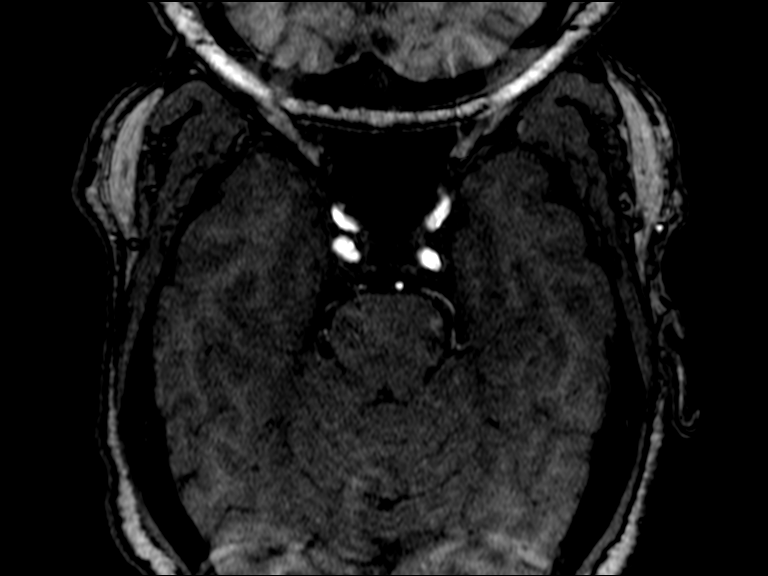
[im 294/346]
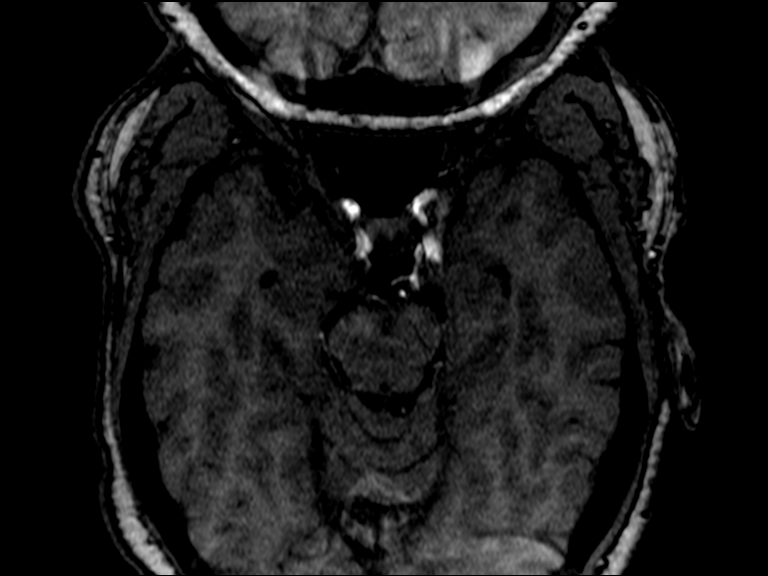
[im 331/346]
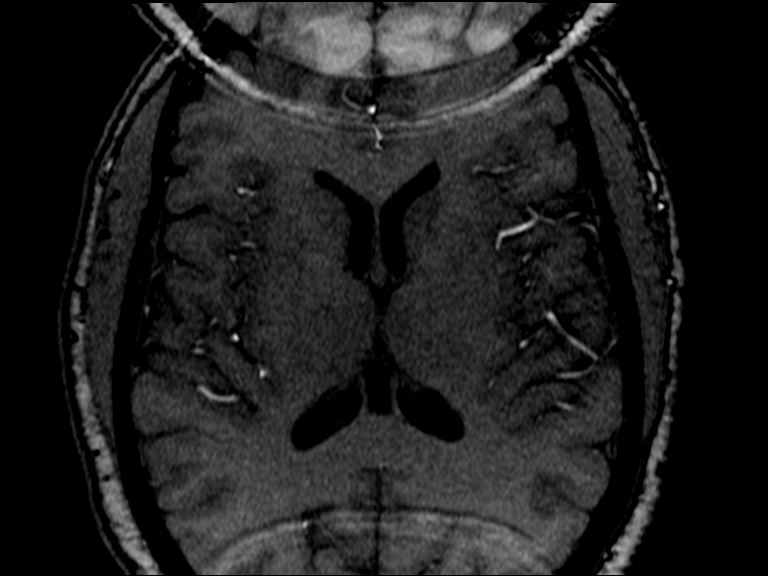

[12 of 48 positions shown; findings below may reference images not displayed]

FINDINGS: Time-of-flight MRA imaging of the neck reveals antegrade flow in
both carotid and vertebral arteries throughout the neck and to the
skullbase. The right vertebral artery appears mildly dominant.

No evidence of stenosis at the right carotid bifurcation. Negative
cervical right ICA.

No evidence of stenosis at the left carotid bifurcation. Negative
cervical left ICA.

Vertebral artery origins are not well identified, especially the
left. No vertebral artery abnormality identified in the neck.
IMPRESSION: Negative noncontrast neck MRA.

## 2019-05-06 ENCOUNTER — Other Ambulatory Visit: Payer: Self-pay

## 2019-05-06 DIAGNOSIS — Z20822 Contact with and (suspected) exposure to covid-19: Secondary | ICD-10-CM

## 2019-05-07 LAB — NOVEL CORONAVIRUS, NAA: SARS-CoV-2, NAA: NOT DETECTED

## 2021-07-19 ENCOUNTER — Telehealth: Payer: Self-pay

## 2021-07-19 NOTE — Telephone Encounter (Signed)
PCP office called to follow up on referral asked for it to be faxed again and I also called the patient to schedule left voicemail.
